# Patient Record
Sex: Female | Born: 1980 | Race: White | Hispanic: No | Marital: Married | Smoking: Never smoker
Health system: Southern US, Community
[De-identification: ages and names within clinical notes are randomized; demographics above are authoritative.]

## PROBLEM LIST (undated history)

## (undated) DIAGNOSIS — F909 Attention-deficit hyperactivity disorder, unspecified type: Secondary | ICD-10-CM

## (undated) DIAGNOSIS — R7303 Prediabetes: Secondary | ICD-10-CM

## (undated) DIAGNOSIS — I10 Essential (primary) hypertension: Secondary | ICD-10-CM

## (undated) DIAGNOSIS — F419 Anxiety disorder, unspecified: Secondary | ICD-10-CM

## (undated) DIAGNOSIS — R519 Headache, unspecified: Secondary | ICD-10-CM

## (undated) DIAGNOSIS — F329 Major depressive disorder, single episode, unspecified: Secondary | ICD-10-CM

## (undated) DIAGNOSIS — F32A Depression, unspecified: Secondary | ICD-10-CM

## (undated) HISTORY — DX: Depression, unspecified: F32.A

## (undated) HISTORY — DX: Major depressive disorder, single episode, unspecified: F32.9

## (undated) HISTORY — PX: FOOT SURGERY: SHX648

## (undated) HISTORY — DX: Anxiety disorder, unspecified: F41.9

---

## 2014-09-17 ENCOUNTER — Ambulatory Visit (INDEPENDENT_AMBULATORY_CARE_PROVIDER_SITE_OTHER): Payer: No Typology Code available for payment source | Admitting: Emergency Medicine

## 2014-09-17 VITALS — BP 118/72 | HR 102 | Temp 98.1°F | Resp 18 | Ht 65.0 in | Wt 151.8 lb

## 2014-09-17 DIAGNOSIS — B882 Other arthropod infestations: Secondary | ICD-10-CM

## 2014-09-17 DIAGNOSIS — B888 Other specified infestations: Secondary | ICD-10-CM | POA: Diagnosis not present

## 2014-09-17 MED ORDER — DOXYCYCLINE HYCLATE 100 MG PO CAPS: 200.0000 mg | ORAL_CAPSULE | Freq: Two times a day (BID) | ORAL | Status: DC

## 2014-09-17 NOTE — Addendum Note (Signed)
Addended by: Roselee Culver on: 09/17/2014 09:44 AM   Modules accepted: Level of Service

## 2014-09-17 NOTE — Patient Instructions (Signed)
Tick Bite Information Ticks are insects that attach themselves to the skin and draw blood for food. There are various types of ticks. Common types include wood ticks and deer ticks. Most ticks live in shrubs and grassy areas. Ticks can climb onto your body when you make contact with leaves or grass where the tick is waiting. The most common places on the body for ticks to attach themselves are the scalp, neck, armpits, waist, and groin. Most tick bites are harmless, but sometimes ticks carry germs that cause diseases. These germs can be spread to a person during the tick's feeding process. The chance of a disease spreading through a tick bite depends on:   The type of tick.  Time of year.   How long the tick is attached.   Geographic location.  HOW CAN YOU PREVENT TICK BITES? Take these steps to help prevent tick bites when you are outdoors:  Wear protective clothing. Long sleeves and long pants are best.   Wear white clothes so you can see ticks more easily.  Tuck your pant legs into your socks.   If walking on a trail, stay in the middle of the trail to avoid brushing against bushes.  Avoid walking through areas with long grass.  Put insect repellent on all exposed skin and along boot tops, pant legs, and sleeve cuffs.   Check clothing, hair, and skin repeatedly and before going inside.   Brush off any ticks that are not attached.  Take a shower or bath as soon as possible after being outdoors.  WHAT IS THE PROPER WAY TO REMOVE A TICK? Ticks should be removed as soon as possible to help prevent diseases caused by tick bites. 1. If latex gloves are available, put them on before trying to remove a tick.  2. Using fine-point tweezers, grasp the tick as close to the skin as possible. You may also use curved forceps or a tick removal tool. Grasp the tick as close to its head as possible. Avoid grasping the tick on its body. 3. Pull gently with steady upward pressure until  the tick lets go. Do not twist the tick or jerk it suddenly. This may break off the tick's head or mouth parts. 4. Do not squeeze or crush the tick's body. This could force disease-carrying fluids from the tick into your body.  5. After the tick is removed, wash the bite area and your hands with soap and water or other disinfectant such as alcohol. 6. Apply a small amount of antiseptic cream or ointment to the bite site.  7. Wash and disinfect any instruments that were used.  Do not try to remove a tick by applying a hot match, petroleum jelly, or fingernail polish to the tick. These methods do not work and may increase the chances of disease being spread from the tick bite.  WHEN SHOULD YOU SEEK MEDICAL CARE? Contact your health care provider if you are unable to remove a tick from your skin or if a part of the tick breaks off and is stuck in the skin.  After a tick bite, you need to be aware of signs and symptoms that could be related to diseases spread by ticks. Contact your health care provider if you develop any of the following in the days or weeks after the tick bite:  Unexplained fever.  Rash. A circular rash that appears days or weeks after the tick bite may indicate the possibility of Lyme disease. The rash may resemble   a target with a bull's-eye and may occur at a different part of your body than the tick bite.  Redness and swelling in the area of the tick bite.   Tender, swollen lymph glands.   Diarrhea.   Weight loss.   Cough.   Fatigue.   Muscle, joint, or bone pain.   Abdominal pain.   Headache.   Lethargy or a change in your level of consciousness.  Difficulty walking or moving your legs.   Numbness in the legs.   Paralysis.  Shortness of breath.   Confusion.   Repeated vomiting.  Document Released: 04/16/2000 Document Revised: 02/07/2013 Document Reviewed: 09/27/2012 ExitCare Patient Information 2015 [employer]. This information is  not intended to replace advice given to you by your health care provider. Make sure you discuss any questions you have with your health care provider.  

## 2014-09-17 NOTE — Progress Notes (Signed)
Subjective:     Patient ID: Laurie Hicks, female   DOB: 07-05-1980, 34 y.o.   MRN: [id number]  HPI   Found an embedded lone star tick this morning. No improvement with over the counter medications or other home remedies.  Denies other complaint or health concern today.   Review of Systems  Constitutional: Negative for fever, chills and appetite change.  HENT: Negative for congestion, ear pain, postnasal drip, sinus pressure and sore throat.   Eyes: Negative for pain and redness.  Respiratory: Negative for cough, shortness of breath and wheezing.   Cardiovascular: Negative for leg swelling.  Gastrointestinal: Negative for nausea, vomiting, abdominal pain, diarrhea, constipation and blood in stool.  Endocrine: Negative for polyuria.  Genitourinary: Negative for dysuria, urgency, frequency and flank pain.  Musculoskeletal: Negative for gait problem.  Skin: Negative for rash.  Neurological: Negative for weakness and headaches.  Psychiatric/Behavioral: Negative for confusion and decreased concentration. The patient is not nervous/anxious.     .    Objective:   Physical Exam  Constitutional: She appears well-developed and well-nourished.  HENT:  Head: Normocephalic and atraumatic.  Eyes: Conjunctivae are normal. Pupils are equal, round, and reactive to light.  Neck: Normal range of motion. Neck supple.  Pulmonary/Chest: Effort normal.  Musculoskeletal: Normal range of motion. She exhibits no edema.  Neurological: She is alert.  Skin: Skin is warm and dry.  Psychiatric: She has a normal mood and affect. Her behavior is normal. Thought content normal.       Assessment:     Tick infestation Doxycycline Appropriate red flags and actions were discussed with the patient who understands.      Plan:     Doxycycline 200 once

## 2016-07-28 DIAGNOSIS — F411 Generalized anxiety disorder: Secondary | ICD-10-CM | POA: Insufficient documentation

## 2018-03-23 ENCOUNTER — Other Ambulatory Visit: Payer: Self-pay | Admitting: Podiatry

## 2018-03-23 ENCOUNTER — Ambulatory Visit: Payer: Managed Care, Other (non HMO) | Admitting: Podiatry

## 2018-03-23 ENCOUNTER — Ambulatory Visit (INDEPENDENT_AMBULATORY_CARE_PROVIDER_SITE_OTHER): Payer: Managed Care, Other (non HMO)

## 2018-03-23 VITALS — BP 135/85 | HR 94

## 2018-03-23 DIAGNOSIS — D3610 Benign neoplasm of peripheral nerves and autonomic nervous system, unspecified: Secondary | ICD-10-CM

## 2018-03-23 DIAGNOSIS — G5792 Unspecified mononeuropathy of left lower limb: Secondary | ICD-10-CM | POA: Diagnosis not present

## 2018-03-23 DIAGNOSIS — M79672 Pain in left foot: Secondary | ICD-10-CM

## 2018-03-23 MED ORDER — TRIAMCINOLONE ACETONIDE 10 MG/ML IJ SUSP
10.0000 mg | Freq: Once | INTRAMUSCULAR | Status: AC
  Administered 2018-03-23: 10 mg

## 2018-03-23 NOTE — Patient Instructions (Signed)
Morton Neuralgia Morton neuralgia is a type of foot pain in the area closest to your toes. This area is sometimes called the ball of your foot. Morton neuralgia occurs when a branch of a nerve in your foot (digital nerve) becomes compressed. When this happens over a long period of time, the nerve can thicken (neuroma) and cause pain. This usually occurs between the third and fourth toe. Morton neuralgia can come and go but may get worse over time. What are the causes? Your digital nerve can become compressed and stretched at a point where it passes under a thick band of tissue that connects your toes (intermetatarsal ligament). Morton neuralgia can be caused by mild repetitive damage in this area. This type of damage can result from:  Activities such as running or jumping.  Wearing shoes that are too tight.  What increases the risk? You may be at risk for Morton neuralgia if you:  Are female.  Wear high heels.  Wear shoes that are narrow or tight.  Participate in activities that stretch your toes. These include: ? Running. ? Ballet. ? Long-distance walking.  What are the signs or symptoms? The first symptom of Morton neuralgia is pain that spreads from the ball of your foot to your toes. It may feel like you are walking on a marble. Pain usually gets worse with walking and goes away at night. Other symptoms may include numbness and cramping of your toes. How is this diagnosed? Your health care provider will do a physical exam. When doing the exam, your health care provider may:  Squeeze your foot just behind your toe.  Ask you to move your toes to check for pain.  You may also have tests on your foot to confirm the diagnosis. These may include:  An X-ray.  An MRI.  How is this treated? Treatment for Morton neuralgia may be as simple as changing the kind of shoes you wear. Other treatments may include:  Wearing a supportive pad (orthosis) under the front of your foot. This  lifts your toe bones and takes pressure off the nerve.  Getting injections of numbing medicine and anti-inflammatory medicine (steroid) in the nerve.  Having surgery to remove part of the thickened nerve.  Follow these instructions at home:  Take medicine only as directed by your health care provider.  Wear soft-soled shoes with a wide toe area.  Stop activities that may be causing pain.  Elevate your foot when resting.  Massage your foot.  Apply ice to the injured area: ? Put ice in a plastic bag. ? Place a towel between your skin and the bag. ? Leave the ice on for 20 minutes, 2-3 times a day.  Keep all follow-up visits as directed by your health care provider. This is important. Contact a health care provider if:  Home care instructions are not helping you get better.  Your symptoms change or get worse. This information is not intended to replace advice given to you by your health care provider. Make sure you discuss any questions you have with your health care provider. Document Released: 07/26/2000 Document Revised: 09/25/2015 Document Reviewed: 06/20/2013 Elsevier Interactive Patient Education  2018 Elsevier Inc.  

## 2018-03-27 NOTE — Progress Notes (Signed)
Subjective:   Patient ID: Laurie Hicks, female   DOB: 37 y.o.   MRN: [id number]   HPI 37 year old female presents the office today for concerns of pain to the left forefoot which gets worse with walking.  She states she gets some burning and aching sensation.  She gets in between the second and third toes more than on the foot she gets some numbness and burning to the second and third toes as well.  She was given a steroid injection which did not help.  She states that it is worse with certain shoes.  She has no other concerns today.   Review of Systems  All other systems reviewed and are negative.  Past Medical History:  Diagnosis Date  . Anxiety   . Depression     Past Surgical History:  Procedure Laterality Date  . FOOT SURGERY       Current Outpatient Medications:  .  Prenat-FeFmCb-DSS-FA-DHA w/o A (CITRANATAL HARMONY) 27-1-260 MG CAPS, Take by mouth., Disp: , Rfl:  .  doxycycline (VIBRAMYCIN) 100 MG capsule, Take 2 capsules (200 mg total) by mouth 2 (two) times daily. (Patient not taking: Reported on 03/23/2018), Disp: 2 capsule, Rfl: 0 .  etonogestrel (NEXPLANON) 74 MG IMPL implant, 1 each by Subdermal route once., Disp: , Rfl:  .  trimethoprim-polymyxin b (POLYTRIM) ophthalmic solution, Place one drop into both eyes 3 (three) times a day., Disp: , Rfl:   Allergies  Allergen Reactions  . Penicillins         Objective:  Physical Exam  General: AAO x3, NAD  Dermatological: Skin is warm, dry and supple bilateral. Nails x 10 are well manicured; remaining integument appears unremarkable at this time. There are no open sores, no preulcerative lesions, no rash or signs of infection present.  Vascular: Dorsalis Pedis artery and Posterior Tibial artery pedal pulses are 2/4 bilateral with immedate capillary fill time. Pedal hair growth present. No varicosities and no lower extremity edema present bilateral. There is no pain with calf compression, swelling, warmth, erythema.    Neruologic: Grossly intact via light touch bilateral. Protective threshold with Semmes Wienstein monofilament intact to all pedal sites bilateral.  Negative Tinel sign.  Musculoskeletal: There is tenderness palpation of the second interspace left foot.  There is a palpable neuroma identified.  There is no area pinpoint tenderness or pain to vibratory sensation.  There is minimal swelling to the second interspace plantarly there is no erythema or warmth.  No swelling dorsally.  Muscular strength 5/5 in all groups tested bilateral.  Gait: Unassisted, Nonantalgic.       Assessment:   Left second interspace pain, likely neuroma     Plan:  -Treatment options discussed including all alternatives, risks, and complications -Etiology of symptoms were discussed -X-rays were obtained and reviewed with the patient.  No definitive evidence of acute fracture or stress fracture identified today. -Direct steroid injection was performed in the left second interspace without complication.  See procedure note below. -Dispensed offloading pads.  We discussed shoe modifications and orthotics.  Procedure: Injection neuroma Discussed alternatives, risks, complications and verbal consent was obtained.  Location: Left second interspace Skin Prep: Alcohol. Injectate: 0.5cc 0.5% marcaine plain, 0.5 cc 2% lidocaine plain and, 1 cc kenalog 10. Disposition: Patient tolerated procedure well. Injection site dressed with a band-aid.  Post-injection care was discussed and return precautions discussed.    Trula Slade DPM

## 2018-03-28 ENCOUNTER — Telehealth: Payer: Self-pay | Admitting: Podiatry

## 2018-03-28 NOTE — Telephone Encounter (Signed)
Pt seen 11/21 and given injection in foot, still experiencing pain. Pt has follow up appt scheduled for 12/16. Should pt try to come in sooner? Please give pt a call.

## 2018-03-28 NOTE — Telephone Encounter (Signed)
Pt states the pain worsened over the weekend and now is back to the way it was prior to the injection. Pt states she is trying to get pregnant and can not take the antiinflammatories. Pt states she is using ice and tylenol. I told pt she could be having a steroid flare after the injections since it began right after the injection. Pt states she looked it up and that's what she thought too. I told pt I could transfer to schedulers for an appt, or she could continue the ice and a stiff bottom shoe until she came in for 04/17/2018, or I could get her an earlier appt. Pt states she will think about it and check her schedule and call again.

## 2018-04-04 ENCOUNTER — Encounter: Payer: Self-pay | Admitting: Podiatry

## 2018-04-04 ENCOUNTER — Ambulatory Visit: Payer: Managed Care, Other (non HMO) | Admitting: Podiatry

## 2018-04-04 DIAGNOSIS — D3610 Benign neoplasm of peripheral nerves and autonomic nervous system, unspecified: Secondary | ICD-10-CM | POA: Diagnosis not present

## 2018-04-04 DIAGNOSIS — M79672 Pain in left foot: Secondary | ICD-10-CM

## 2018-04-04 MED ORDER — MELOXICAM 15 MG PO TABS: 15.0000 mg | ORAL_TABLET | Freq: Every day | ORAL | 0 refills | Status: AC

## 2018-04-05 ENCOUNTER — Telehealth: Payer: Self-pay | Admitting: Podiatry

## 2018-04-05 ENCOUNTER — Telehealth: Payer: Self-pay | Admitting: *Deleted

## 2018-04-05 DIAGNOSIS — M79672 Pain in left foot: Secondary | ICD-10-CM

## 2018-04-05 DIAGNOSIS — D3610 Benign neoplasm of peripheral nerves and autonomic nervous system, unspecified: Secondary | ICD-10-CM

## 2018-04-05 MED ORDER — TRAMADOL HCL 50 MG PO TABS: 50.0000 mg | ORAL_TABLET | Freq: Two times a day (BID) | ORAL | 0 refills | Status: DC | PRN

## 2018-04-05 NOTE — Addendum Note (Signed)
Addended by: Harriett Sine D on: 04/05/2018 05:21 PM   Modules accepted: Orders

## 2018-04-05 NOTE — Telephone Encounter (Signed)
I would recommend to continue with the boot. If she needs we can do tramadol '50mg'$  BID to help with pain as well. Can you also order a ESR, CRP, uric acid, Rheumatoid Factor, ANA, Anti-CCP. Can you please see if they can do the ultrasound ASAP. If they cannot do the ultrasound soon we can see if we can get sports medicine (Dr. Oneida Alar) to see her for an ultrasound.

## 2018-04-05 NOTE — Telephone Encounter (Signed)
Faxed orders to [facility]. 

## 2018-04-05 NOTE — Telephone Encounter (Signed)
I informed pt of Dr. Leigh Aurora orders and informed of the Tramadol rx. Pt asked if she could get information on the Tramadol from the pharmacist and I told her yes. I gave pt the [facility] [phone] scheduling.

## 2018-04-05 NOTE — Telephone Encounter (Signed)
Pt was seen yesterday and was prescribed anti inflammatory medication and was put in a boot until ultrasound is scheduled. Pt is now experiencing severe pain that comes and goes. Pt is fine in the morning and walks with boot for a while until pain starts to come back, when she takes the boot off to ice, her feet/toes hurt/burn.  Please give pt a call to discuss options to help with pain.

## 2018-04-05 NOTE — Telephone Encounter (Signed)
-----   Message from Trula Slade, DPM sent at 04/04/2018  1:18 PM EST ----- Can you please order a diagnostic ultrasound to look for a neuroma

## 2018-04-05 NOTE — Telephone Encounter (Addendum)
Pt states if she does any type of walking it hurts, pt gradually worsens with activity through the day and once removes the boot to ice is unbearable, and has worsened daily. Pt states pain worsens about for about 30 minutes after elevating. I asked pt is she could possibly have the boot on too tight and she states she is not wearing the boot inflated.

## 2018-04-06 ENCOUNTER — Telehealth: Payer: Self-pay | Admitting: *Deleted

## 2018-04-06 NOTE — Telephone Encounter (Signed)
Patient came in this morning and wanted to get the blood work paper work and I stated that the epic system was down and that I would call when the system was back up. Lattie Haw

## 2018-04-06 NOTE — Telephone Encounter (Signed)
Called [facility] and spoke with Selinda Flavin and could he get it  verified that the blood work paper work was there at [facility] and he stated that he tried to call the facility and there was no answer would send an email to the supervisor just to let them know and I also gave the office number [phone]. Laurie Hicks

## 2018-04-06 NOTE — Telephone Encounter (Signed)
Orders for blood work sent to [facility]

## 2018-04-06 NOTE — Addendum Note (Signed)
Addended by: Harriett Sine D on: 04/06/2018 09:19 AM   Modules accepted: Orders

## 2018-04-10 ENCOUNTER — Ambulatory Visit: Payer: Managed Care, Other (non HMO) | Admitting: Podiatry

## 2018-04-10 LAB — C-REACTIVE PROTEIN: CRP: 4.9 mg/L (ref <8.0)

## 2018-04-10 LAB — SEDIMENTATION RATE: Sed Rate: 29 mm/h — ABNORMAL HIGH (ref 0–20)

## 2018-04-10 LAB — CYCLIC CITRUL PEPTIDE ANTIBODY, IGG

## 2018-04-10 LAB — URIC ACID: URIC ACID, SERUM: 3.6 mg/dL (ref 2.5–7.0)

## 2018-04-10 LAB — ANA: ANA: NEGATIVE

## 2018-04-10 LAB — RHEUMATOID FACTOR: Rheumatoid fact SerPl-aCnc: <14 IU/mL (ref <14)

## 2018-04-10 NOTE — Progress Notes (Signed)
Subjective: 37 year old female presents the office today for concerns of worsening pain to the left foot.  She states the day of the injection had some pain but it got somewhat better but then the pain has worsened.  She states that over the last couple days the pain is gradually getting worse and is the worst it has been yet.  The pain is still intermittent.  Describes a burning, sharp pain at the second and third toes. Denies any systemic complaints such as fevers, chills, nausea, vomiting. No acute changes since last appointment, and no other complaints at this time.   Objective: AAO x3, NAD DP/PT pulses palpable bilaterally, CRT less than 3 seconds There is continuation of tenderness on the left foot on second interspace.  There is no specific area pinpoint bony tenderness.  His pain is actually better now than what it was however overall the pain is getting worse. No open lesions or pre-ulcerative lesions.  No pain with calf compression, swelling, warmth, erythema  Assessment: Worsening left foot pain  Plan: -All treatment options discussed with the patient including all alternatives, risks, complications.  -At this point given the pain is worsening we will order ultrasound to evaluate the neuroma or any other issues on the interspace.  I want her to walk a couple areas before she goes that way we can see exactly where the pain is at. -Prescribed mobic. Discussed side effects of the medication and directed to stop if any are to occur and call the office.  -She has a cam boot at home and I discussed that she can wear this as well to help immobilize it.  -Patient encouraged to call the office with any questions, concerns, change in symptoms.   Trula Slade DPM

## 2018-04-11 ENCOUNTER — Ambulatory Visit (HOSPITAL_COMMUNITY)
Admission: RE | Admit: 2018-04-11 | Discharge: 2018-04-11 | Disposition: A | Discharge status: Home / Self Care | Payer: Managed Care, Other (non HMO) | Source: Ambulatory Visit | Attending: Podiatry | Admitting: Podiatry

## 2018-04-11 DIAGNOSIS — G5762 Lesion of plantar nerve, left lower limb: Secondary | ICD-10-CM | POA: Diagnosis not present

## 2018-04-11 DIAGNOSIS — D3610 Benign neoplasm of peripheral nerves and autonomic nervous system, unspecified: Secondary | ICD-10-CM | POA: Insufficient documentation

## 2018-04-11 DIAGNOSIS — M79672 Pain in left foot: Secondary | ICD-10-CM

## 2018-04-12 ENCOUNTER — Telehealth: Payer: Self-pay | Admitting: Podiatry

## 2018-04-12 MED ORDER — DICLOFENAC SODIUM 1 % TD GEL: 2.0000 g | Freq: Four times a day (QID) | TRANSDERMAL | 5 refills | Status: DC

## 2018-04-12 NOTE — Telephone Encounter (Signed)
I informed pt [place] results were available and I had informed DR. Wagoner. I told pt she should stop the Meloxicam if taking with food was not helping. Pt states she has IBS and she didn't know if she was having a flare. I told pt I would ask Dr. Jacqualyn Posey if she could use a topical antiinflammatory. Pt states she and her husband would be trying to get pregnant in the next couple of days and to check if the topical would be okay also.

## 2018-04-12 NOTE — Addendum Note (Signed)
Addended by: Harriett Sine D on: 04/12/2018 04:13 PM   Modules accepted: Orders

## 2018-04-12 NOTE — Telephone Encounter (Signed)
I informed pt of Dr. Leigh Aurora review of results and orders for the topical Voltaren Gel, and the voltaren gel would need insurance approval, but she may decide to self-pay for the gel, and check with the pharmacist if it is okay to take while trying to get pregnant or pregnant. Transferred to scheduler for an appt.

## 2018-04-12 NOTE — Telephone Encounter (Signed)
Please let her know that the ultrasound does shoe a 1.4 cm mass between the metatarsals that favors a mortons neuroma. Can try topical voltaren gel and would hold off on oral medications. Please have her come in to be seen and we can discuss further options.

## 2018-04-12 NOTE — Telephone Encounter (Signed)
Pt had ultrasound yesterday and is following up to see if we have results. Pt also states she is taking an anti inflammatory and is having some stomach trouble, didn't know if the dosage could be decreased. Please give pt a call.

## 2018-04-14 ENCOUNTER — Telehealth: Payer: Self-pay | Admitting: *Deleted

## 2018-04-14 ENCOUNTER — Ambulatory Visit: Payer: Managed Care, Other (non HMO) | Admitting: Podiatry

## 2018-04-14 ENCOUNTER — Telehealth: Payer: Self-pay | Admitting: Podiatry

## 2018-04-14 ENCOUNTER — Encounter: Payer: Self-pay | Admitting: Podiatry

## 2018-04-14 DIAGNOSIS — D3610 Benign neoplasm of peripheral nerves and autonomic nervous system, unspecified: Secondary | ICD-10-CM | POA: Diagnosis not present

## 2018-04-14 NOTE — Telephone Encounter (Signed)
-----   Message from Trula Slade, DPM sent at 04/12/2018  3:27 PM EST ----- Val- please let her know that the blood work was mostly normal. SED rate was slightly increased which just measures inflammation but is nonspecific.

## 2018-04-14 NOTE — Telephone Encounter (Signed)
I called pt and she states she was seen by Dr. Jacqualyn Posey today, and it is a neuroma and she is waiting for a call from the surgical coordinator.

## 2018-04-14 NOTE — Patient Instructions (Signed)

## 2018-04-14 NOTE — Telephone Encounter (Signed)
I called and told Laurie Hicks we would get her rescheduled for this Wednesday, 18 December. I told her someone from the surgical center would call 24-48 hours before to let her know what time to arrive. Pt asked about insurance and if it needed prior authorization. I told her I called and it did not require prior authorization.

## 2018-04-14 NOTE — Telephone Encounter (Signed)
I was just there and had scheduled surgery for Monday, 30 December. Dr. Jacqualyn Posey mentioned he could possibly do this coming Wednesday. Is that still available? It would work better for me. I told the pt I would double check with Dr. Jacqualyn Posey and then I would call her back.

## 2018-04-17 ENCOUNTER — Ambulatory Visit: Payer: Managed Care, Other (non HMO) | Admitting: Podiatry

## 2018-04-17 NOTE — Telephone Encounter (Signed)
Do you want me to call her and tell her anything to do if she plans on still traveling to [place]?

## 2018-04-17 NOTE — Progress Notes (Signed)
Subjective: 37 year old female presents the office today for evaluation of left foot pain.  She states that the pain is been intermittent.  She has pain in the mornings when she first gets up and she has lot of burning to her second and third toes during the day as well.  She has not had to wear the boot intermittently but she did come out of the boot as it was making her foot hurt worse.  The injection did not help.  This point she presents to discuss ultrasound results and further treatment. Denies any systemic complaints such as fevers, chills, nausea, vomiting. No acute changes since last appointment, and no other complaints at this time.   Objective: AAO x3, NAD DP/PT pulses palpable bilaterally, CRT less than 3 seconds There is continuation of tenderness palpation along the second interspace of the left foot.  Palpable neuroma is identified.  Subjectively she is getting sharp pains in the second and third toes as well.  No area pinpoint tenderness or pain vibratory sensation.  Mild swelling along the interspace.  No open lesions or pre-ulcerative lesions.  No pain with calf compression, swelling, warmth, erythema  Assessment: Left foot neuroma  Plan: -All treatment options discussed with the patient including all alternatives, risks, complications.  -Ultrasound results were discussed which did reveal a neuroma. Blood work discussed.  We did discussion regards to conservative as well as surgical treatment options.  After discussion she wants to proceed with surgical intervention.  We discussed with her surgical excision of the neuroma.  Discussed alternatives, risks, complications.  No promises or guarantees were given as yet, the procedure and all questions were answered the best my ability. -The incision placement as well as the postoperative course was discussed with the patient. I discussed risks of the surgery which include, but not limited to, infection, bleeding, pain, swelling, need for  further surgery, delayed or nonhealing, painful or ugly scar, numbness or sensation changes, over/under correction, recurrence, transfer lesions, further deformity, hardware failure, DVT/PE, loss of toe/foot. Patient understands these risks and wishes to proceed with surgery. The surgical consent was reviewed with the patient all 3 pages were signed. No promises or guarantees were given to the outcome of the procedure. All questions were answered to the best of my ability. Before the surgery the patient was encouraged to call the office if there is any further questions. The surgery will be performed at the [facility] on an outpatient basis. -She is trying to get pregnant and discussed if she is pregnant we will have to hold off on surgery.  -Patient encouraged to call the office with any questions, concerns, change in symptoms.   Trula Slade DPM

## 2018-04-17 NOTE — Telephone Encounter (Signed)
We talked about it when she was here. She was going to talk to her husband.

## 2018-04-17 NOTE — Telephone Encounter (Signed)
Whatever works best for her. However, I do not want to do surgery then her drive to [place]

## 2018-04-19 ENCOUNTER — Telehealth: Payer: Self-pay | Admitting: Podiatry

## 2018-04-19 ENCOUNTER — Encounter: Payer: Self-pay | Admitting: Podiatry

## 2018-04-19 ENCOUNTER — Other Ambulatory Visit: Payer: Self-pay | Admitting: Podiatry

## 2018-04-19 DIAGNOSIS — G5762 Lesion of plantar nerve, left lower limb: Secondary | ICD-10-CM

## 2018-04-19 MED ORDER — OXYCODONE-ACETAMINOPHEN 5-325 MG PO TABS: 1.0000 | ORAL_TABLET | ORAL | 0 refills | Status: DC | PRN

## 2018-04-19 MED ORDER — PROMETHAZINE HCL 25 MG PO TABS: 25.0000 mg | ORAL_TABLET | Freq: Three times a day (TID) | ORAL | 0 refills | Status: DC | PRN

## 2018-04-19 MED ORDER — CLINDAMYCIN HCL 150 MG PO CAPS: 150.0000 mg | ORAL_CAPSULE | Freq: Three times a day (TID) | ORAL | 0 refills | Status: DC

## 2018-04-19 NOTE — Telephone Encounter (Signed)
I called pt and asked if she wanted the Ibuprofen 800mg  called to the pharmacy and she stated no, she just wanted to know if Dr. Jacqualyn Posey had called in a prescription.

## 2018-04-19 NOTE — Telephone Encounter (Signed)
Pt had surgery this morning. Was told to take 800mg  of ibuprofen when she got home.  Should pt be taking over the counter or should it have been a prescription? Please give pt a call.

## 2018-04-21 ENCOUNTER — Encounter: Payer: Managed Care, Other (non HMO) | Admitting: Podiatry

## 2018-04-21 ENCOUNTER — Telehealth: Payer: Self-pay | Admitting: *Deleted

## 2018-04-21 NOTE — Telephone Encounter (Signed)
Called and spoke with the patient yesterday and patient stated that she was feeling a lot better and stated that there was no fever or chills and no nausea and I stated to call the office if any concerns or questions. Laurie Hicks

## 2018-04-24 ENCOUNTER — Other Ambulatory Visit: Payer: Managed Care, Other (non HMO)

## 2018-04-24 ENCOUNTER — Ambulatory Visit (INDEPENDENT_AMBULATORY_CARE_PROVIDER_SITE_OTHER): Payer: Managed Care, Other (non HMO) | Admitting: Podiatry

## 2018-04-24 ENCOUNTER — Other Ambulatory Visit: Payer: Self-pay | Admitting: Podiatry

## 2018-04-24 ENCOUNTER — Encounter: Payer: Self-pay | Admitting: Podiatry

## 2018-04-24 VITALS — BP 97/77 | HR 103 | Temp 98.1°F | Resp 18

## 2018-04-24 DIAGNOSIS — D3610 Benign neoplasm of peripheral nerves and autonomic nervous system, unspecified: Secondary | ICD-10-CM

## 2018-04-24 NOTE — Progress Notes (Signed)
Subjective: Laurie Hicks is a 37 y.o. is seen today in office s/p left foot neuroma excision preformed on 04/19/2018. She states that her pain is controlled. She has remained in the CAM boot. Denies any systemic complaints such as fevers, chills, nausea, vomiting. No calf pain, chest pain, shortness of breath.   Objective: General: No acute distress, AAOx3  DP/PT pulses palpable 2/4, CRT < 3 sec to all digits.  Protective sensation intact. Motor function intact.  LEFT foot: Incision is well coapted without any evidence of dehiscence and sutures are intact. There is no surrounding erythema, ascending cellulitis, fluctuance, crepitus, malodor, drainage/purulence. There is trace edema around the surgical site. There is no pain along the surgical site.  Mild ecchymosis present to the toes. No other areas of tenderness to bilateral lower extremities.  No other open lesions or pre-ulcerative lesions.  No pain with calf compression, swelling, warmth, erythema.   Assessment and Plan:  Status post left foot neuroma excision, doing well with no complications   -Treatment options discussed including all alternatives, risks, and complications -Incisions healing well without any dehiscence or signs of infection.  Antibiotic ointment and a bandage was applied. -Ice/elevation -Pain medication as needed-she has not been taking any -Continue cam boot. -She is been traveling to [place] today.  Encouraged elevation and ice.  I want her to get the car and walk every couple hours to help with circulation.  Baby aspirin. -Monitor for any clinical signs or symptoms of infection and DVT/PE and directed to call the office immediately should any occur or go to the ER. -Follow-up in 10 days or sooner if any problems arise. In the meantime, encouraged to call the office with any questions, concerns, change in symptoms.   Celesta Gentile, DPM

## 2018-04-24 NOTE — Telephone Encounter (Signed)
I asked pt if she wanted a refill of the meloxicam and she stated no she was using the ibuprofen.

## 2018-04-28 ENCOUNTER — Encounter: Payer: Self-pay | Admitting: Podiatry

## 2018-05-04 ENCOUNTER — Ambulatory Visit (INDEPENDENT_AMBULATORY_CARE_PROVIDER_SITE_OTHER): Payer: Self-pay | Admitting: Podiatry

## 2018-05-04 DIAGNOSIS — D3610 Benign neoplasm of peripheral nerves and autonomic nervous system, unspecified: Secondary | ICD-10-CM

## 2018-05-09 NOTE — Progress Notes (Signed)
Subjective: Laurie Hicks is a 38 y.o. is seen today in office s/p left foot neuroma excision preformed on 04/19/2018. She states that her pain is controlled and overall she is doing well. She has remained in the CAM boot.  She did develop a fracture of her lower leg and she is not sure if this was coming from the boot bandage and a heat rash but overall this is been getting better as well.  Denies any systemic complaints such as fevers, chills, nausea, vomiting. No calf pain, chest pain, shortness of breath.   Objective: General: No acute distress, AAOx3  DP/PT pulses palpable 2/4, CRT < 3 sec to all digits.  Protective sensation intact. Motor function intact.  LEFT foot: Incision is well coapted without any evidence of dehiscence and sutures are intact. There is no surrounding erythema, ascending cellulitis, fluctuance, crepitus, malodor, drainage/purulence. There is trace edema around the surgical site. There is no pain along the surgical site.  Ecchymosis is resolving.  Incision appears to be healing well with any signs of infection or dehiscence.  The rash the lower leg is almost resolved. No other areas of tenderness to bilateral lower extremities.  No other open lesions or pre-ulcerative lesions.  No pain with calf compression, swelling, warmth, erythema.   Assessment and Plan:  Status post left foot neuroma excision, doing well with no complications   -Treatment options discussed including all alternatives, risks, and complications -Suture ends were cut today.  Antibiotic ointment and a bandage was applied.  She can start to shower tomorrow went over dry thoroughly and apply a similar bandage.  Continue surgical shoe for now.  Continue to ice elevate.  Pain medication as needed but she has not been requiring much. -Monitor for any clinical signs or symptoms of infection and DVT/PE and directed to call the office immediately should any occur or go to the ER. -Follow-up as scheduled or  sooner if any problems arise. In the meantime, encouraged to call the office with any questions, concerns, change in symptoms.   Celesta Gentile, DPM

## 2018-05-16 ENCOUNTER — Ambulatory Visit (INDEPENDENT_AMBULATORY_CARE_PROVIDER_SITE_OTHER): Payer: BLUE CROSS/BLUE SHIELD | Admitting: Podiatry

## 2018-05-16 DIAGNOSIS — D3610 Benign neoplasm of peripheral nerves and autonomic nervous system, unspecified: Secondary | ICD-10-CM

## 2018-05-16 NOTE — Progress Notes (Signed)
Subjective: Laurie Hicks is a 38 y.o. is seen today in office s/p left foot neuroma excision preformed on 04/19/2018.  She says that she is doing better.  Her pain is overall much improved.  She denies any injury or falls and says is doing well.  She was discussed getting back into regular shoes and swelling. She has no other concerns. Denies any systemic complaints such as fevers, chills, nausea, vomiting. No calf pain, chest pain, shortness of breath.   Objective: General: No acute distress, AAOx3  DP/PT pulses palpable 2/4, CRT < 3 sec to all digits.  Protective sensation intact. Motor function intact.  LEFT foot: Incision is well coapted without any evidence of dehiscence and a single sutures intact to the proximal aspect.  Overall the incision is doing well the scar's point.  No surrounding erythema, ascending cellulitis.  There is no fluctuation crepitation any malodor and no evidence of dehiscence.   No other areas of tenderness to bilateral lower extremities.  No other open lesions or pre-ulcerative lesions.  No pain with calf compression, swelling, warmth, erythema.   Assessment and Plan:  Status post left foot neuroma excision, doing well with no complications   -Treatment options discussed including all alternatives, risks, and complications -Overall she is doing much better.  Discussed gradually transition back into regular shoe.  Continue ice and elevate.  Compression anklet.  On a hold off on swimming for now get back into a regular shoe first.  Return in about 3 weeks (around 06/06/2018).  Trula Slade DPM

## 2018-06-01 ENCOUNTER — Ambulatory Visit (INDEPENDENT_AMBULATORY_CARE_PROVIDER_SITE_OTHER): Payer: BLUE CROSS/BLUE SHIELD | Admitting: Podiatry

## 2018-06-01 ENCOUNTER — Encounter: Payer: Self-pay | Admitting: Podiatry

## 2018-06-01 DIAGNOSIS — D3610 Benign neoplasm of peripheral nerves and autonomic nervous system, unspecified: Secondary | ICD-10-CM

## 2018-06-02 NOTE — Progress Notes (Signed)
Subjective: Laurie Hicks is a 38 y.o. is seen today in office s/p left foot neuroma excision preformed on 04/19/2018.  She states that she is doing well and she is back into a regular shoe.  She has some very minimal occasional discomfort but she is back to doing regular activity and wearing regular shoes or even going barefoot but any significant pain she feels much better than she did prior to surgery.  She has no significant swelling.  Scar is well formed.  She denies any fevers, chills, nausea, vomiting.  No calf pain, chest pain, shortness of breath.  No other concerns.   Objective: General: No acute distress, AAOx3  DP/PT pulses palpable 2/4, CRT < 3 sec to all digits.  Protective sensation intact. Motor function intact.  LEFT foot: Incision is well coapted without any evidence of dehiscence a scar is well formed.  There is no surrounding erythema, ascending cellulitis.  No drainage or pus.  Very minimal edema.  There is no tenderness palpation the surgical site has no other areas of tenderness. No other areas of tenderness to bilateral lower extremities.  No other open lesions or pre-ulcerative lesions.  No pain with calf compression, swelling, warmth, erythema.   Assessment and Plan:  Status post left foot neuroma excision, doing well with no complications   -Treatment options discussed including all alternatives, risks, and complications -She is continued to improve and currently with very minimal discomfort subjectively but no pain today.  We discussed wearing supportive shoes and inserts for her shoes.  She is going to look at changing her shoes as well.  She can gradually get back to activity in her normal exercise.  At this point since she is doing well with discharge her from the postoperative care but there is any issues she is to let me know and she agrees with this plan has no further questions.  Trula Slade DPM

## 2018-08-25 LAB — OB RESULTS CONSOLE GC/CHLAMYDIA
Chlamydia: NEGATIVE
Gonorrhea: NEGATIVE

## 2018-08-25 LAB — OB RESULTS CONSOLE RUBELLA ANTIBODY, IGM: Rubella: IMMUNE

## 2018-08-25 LAB — OB RESULTS CONSOLE HEPATITIS B SURFACE ANTIGEN: Hepatitis B Surface Ag: NEGATIVE

## 2018-08-25 LAB — OB RESULTS CONSOLE HIV ANTIBODY (ROUTINE TESTING): HIV: NONREACTIVE

## 2019-02-28 LAB — OB RESULTS CONSOLE GBS: GBS: NEGATIVE

## 2019-03-08 ENCOUNTER — Telehealth (HOSPITAL_COMMUNITY): Payer: Self-pay

## 2019-03-08 ENCOUNTER — Encounter (HOSPITAL_COMMUNITY): Payer: Self-pay

## 2019-03-11 ENCOUNTER — Other Ambulatory Visit (HOSPITAL_COMMUNITY)
Admission: RE | Admit: 2019-03-11 | Discharge: 2019-03-11 | Disposition: A | Discharge status: Home / Self Care | Payer: BC Managed Care – PPO | Source: Ambulatory Visit | Attending: Obstetrics and Gynecology | Admitting: Obstetrics and Gynecology

## 2019-03-11 ENCOUNTER — Other Ambulatory Visit: Payer: Self-pay

## 2019-03-11 DIAGNOSIS — Z20828 Contact with and (suspected) exposure to other viral communicable diseases: Secondary | ICD-10-CM | POA: Insufficient documentation

## 2019-03-11 LAB — SARS CORONAVIRUS 2 (TAT 6-24 HRS): SARS Coronavirus 2: NEGATIVE

## 2019-03-11 NOTE — MAU Note (Signed)
Pt here for PAT covid swab. Denies symptoms. Swab collected.  

## 2019-03-13 ENCOUNTER — Inpatient Hospital Stay (HOSPITAL_COMMUNITY): Payer: BC Managed Care – PPO

## 2019-03-13 ENCOUNTER — Other Ambulatory Visit: Payer: Self-pay

## 2019-03-13 ENCOUNTER — Encounter (HOSPITAL_COMMUNITY)
Admission: AD | Disposition: A | Discharge status: Home / Self Care | Payer: Self-pay | Source: Home / Self Care | Attending: Obstetrics & Gynecology

## 2019-03-13 ENCOUNTER — Encounter (HOSPITAL_COMMUNITY): Payer: Self-pay

## 2019-03-13 ENCOUNTER — Inpatient Hospital Stay (HOSPITAL_COMMUNITY): Payer: BC Managed Care – PPO | Admitting: Anesthesiology

## 2019-03-13 ENCOUNTER — Inpatient Hospital Stay (HOSPITAL_COMMUNITY)
Admission: AD | Admit: 2019-03-13 | Discharge: 2019-03-16 | DRG: 788 | Disposition: A | Discharge status: Home / Self Care | Payer: BC Managed Care – PPO | Attending: Obstetrics & Gynecology | Admitting: Obstetrics & Gynecology

## 2019-03-13 DIAGNOSIS — O139 Gestational [pregnancy-induced] hypertension without significant proteinuria, unspecified trimester: Secondary | ICD-10-CM | POA: Diagnosis present

## 2019-03-13 DIAGNOSIS — Z98891 History of uterine scar from previous surgery: Secondary | ICD-10-CM

## 2019-03-13 DIAGNOSIS — O134 Gestational [pregnancy-induced] hypertension without significant proteinuria, complicating childbirth: Secondary | ICD-10-CM | POA: Diagnosis present

## 2019-03-13 DIAGNOSIS — Z3A37 37 weeks gestation of pregnancy: Secondary | ICD-10-CM | POA: Diagnosis not present

## 2019-03-13 LAB — CBC
HCT: 35.1 % — ABNORMAL LOW (ref 36.0–46.0)
HCT: 35.1 % — ABNORMAL LOW (ref 36.0–46.0)
Hemoglobin: 11.7 g/dL — ABNORMAL LOW (ref 12.0–15.0)
Hemoglobin: 11.7 g/dL — ABNORMAL LOW (ref 12.0–15.0)
MCH: 30.2 pg (ref 26.0–34.0)
MCH: 30.2 pg (ref 26.0–34.0)
MCHC: 33.3 g/dL (ref 30.0–36.0)
MCHC: 33.3 g/dL (ref 30.0–36.0)
MCV: 90.5 fL (ref 80.0–100.0)
MCV: 90.7 fL (ref 80.0–100.0)
Platelets: 295 10*3/uL (ref 150–400)
Platelets: 325 10*3/uL (ref 150–400)
RBC: 3.87 MIL/uL (ref 3.87–5.11)
RBC: 3.88 MIL/uL (ref 3.87–5.11)
RDW: 14.8 % (ref 11.5–15.5)
RDW: 14.8 % (ref 11.5–15.5)
WBC: 14.5 10*3/uL — ABNORMAL HIGH (ref 4.0–10.5)
WBC: 15.5 10*3/uL — ABNORMAL HIGH (ref 4.0–10.5)
nRBC: 0 % (ref 0.0–0.2)
nRBC: 0 % (ref 0.0–0.2)

## 2019-03-13 LAB — COMPREHENSIVE METABOLIC PANEL
ALT: 11 U/L (ref 0–44)
AST: 28 U/L (ref 15–41)
Albumin: 3 g/dL — ABNORMAL LOW (ref 3.5–5.0)
Alkaline Phosphatase: 193 U/L — ABNORMAL HIGH (ref 38–126)
Anion gap: 12 (ref 5–15)
BUN: 6 mg/dL (ref 6–20)
CO2: 19 mmol/L — ABNORMAL LOW (ref 22–32)
Calcium: 9.1 mg/dL (ref 8.9–10.3)
Chloride: 105 mmol/L (ref 98–111)
Creatinine, Ser: 0.65 mg/dL (ref 0.44–1.00)
GFR calc Af Amer: >60 mL/min (ref >60)
GFR calc non Af Amer: >60 mL/min (ref >60)
Glucose, Bld: 93 mg/dL (ref 70–99)
Potassium: 4.2 mmol/L (ref 3.5–5.1)
Sodium: 136 mmol/L (ref 135–145)
Total Bilirubin: 0.4 mg/dL (ref 0.3–1.2)
Total Protein: 6.2 g/dL — ABNORMAL LOW (ref 6.5–8.1)

## 2019-03-13 LAB — TYPE AND SCREEN
ABO/RH(D): O POS
Antibody Screen: NEGATIVE

## 2019-03-13 LAB — RPR: RPR Ser Ql: NONREACTIVE

## 2019-03-13 LAB — ABO/RH: ABO/RH(D): O POS

## 2019-03-13 SURGERY — Surgical Case
Anesthesia: Epidural

## 2019-03-13 MED ORDER — OXYTOCIN BOLUS FROM INFUSION: 500.0000 mL | Freq: Once | INTRAVENOUS | Status: DC

## 2019-03-13 MED ORDER — SOD CITRATE-CITRIC ACID 500-334 MG/5ML PO SOLN
30.0000 mL | ORAL | Status: DC | PRN
  Administered 2019-03-13: 21:00:00 30 mL via ORAL
  Filled 2019-03-13: qty 30

## 2019-03-13 MED ORDER — TERBUTALINE SULFATE 1 MG/ML IJ SOLN: 0.2500 mg | Freq: Once | INTRAMUSCULAR | Status: DC | PRN

## 2019-03-13 MED ORDER — EPHEDRINE 5 MG/ML INJ
10.0000 mg | INTRAVENOUS | Status: DC | PRN
  Filled 2019-03-13 (×2): qty 10

## 2019-03-13 MED ORDER — MORPHINE SULFATE (PF) 0.5 MG/ML IJ SOLN
INTRAMUSCULAR | Status: AC
  Filled 2019-03-13: qty 10

## 2019-03-13 MED ORDER — LACTATED RINGERS IV SOLN
INTRAVENOUS | Status: DC
  Administered 2019-03-13 (×4): via INTRAVENOUS

## 2019-03-13 MED ORDER — DEXAMETHASONE SODIUM PHOSPHATE 4 MG/ML IJ SOLN
INTRAMUSCULAR | Status: AC
  Filled 2019-03-13: qty 1

## 2019-03-13 MED ORDER — GENTAMICIN SULFATE 40 MG/ML IJ SOLN
5.0000 mg/kg | Freq: Once | INTRAVENOUS | Status: AC
  Administered 2019-03-13: 370 mg via INTRAVENOUS
  Filled 2019-03-13: qty 9.25

## 2019-03-13 MED ORDER — SODIUM CHLORIDE (PF) 0.9 % IJ SOLN
INTRAMUSCULAR | Status: DC | PRN
  Administered 2019-03-13: 12 mL/h via EPIDURAL

## 2019-03-13 MED ORDER — FENTANYL CITRATE (PF) 100 MCG/2ML IJ SOLN
INTRAMUSCULAR | Status: DC | PRN
  Administered 2019-03-13: 100 ug via EPIDURAL

## 2019-03-13 MED ORDER — LACTATED RINGERS IV SOLN
INTRAVENOUS | Status: DC | PRN
  Administered 2019-03-13: 23:00:00 via INTRAVENOUS

## 2019-03-13 MED ORDER — SODIUM CHLORIDE 0.9 % IV SOLN
INTRAVENOUS | Status: DC | PRN
  Administered 2019-03-13: via INTRAVENOUS

## 2019-03-13 MED ORDER — LACTATED RINGERS IV SOLN
500.0000 mL | INTRAVENOUS | Status: DC | PRN
  Administered 2019-03-13: 20:00:00 500 mL via INTRAVENOUS

## 2019-03-13 MED ORDER — FENTANYL CITRATE (PF) 100 MCG/2ML IJ SOLN
INTRAMUSCULAR | Status: AC
  Filled 2019-03-13: qty 2

## 2019-03-13 MED ORDER — MISOPROSTOL 25 MCG QUARTER TABLET
25.0000 ug | ORAL_TABLET | ORAL | Status: DC | PRN
  Administered 2019-03-13 (×2): 25 ug via VAGINAL
  Filled 2019-03-13 (×2): qty 1

## 2019-03-13 MED ORDER — ZOLPIDEM TARTRATE 5 MG PO TABS
5.0000 mg | ORAL_TABLET | Freq: Every evening | ORAL | Status: DC | PRN
  Administered 2019-03-13: 01:00:00 5 mg via ORAL
  Filled 2019-03-13: qty 1

## 2019-03-13 MED ORDER — PHENYLEPHRINE HCL (PRESSORS) 10 MG/ML IV SOLN
INTRAVENOUS | Status: DC | PRN
  Administered 2019-03-13: 40 ug via INTRAVENOUS
  Administered 2019-03-13: 80 ug via INTRAVENOUS

## 2019-03-13 MED ORDER — PHENYLEPHRINE HCL-NACL 20-0.9 MG/250ML-% IV SOLN: INTRAVENOUS | Status: DC | PRN

## 2019-03-13 MED ORDER — LACTATED RINGERS IV SOLN
500.0000 mL | Freq: Once | INTRAVENOUS | Status: AC
  Administered 2019-03-13: 500 mL via INTRAVENOUS

## 2019-03-13 MED ORDER — ONDANSETRON HCL 4 MG/2ML IJ SOLN
INTRAMUSCULAR | Status: AC
  Filled 2019-03-13: qty 4

## 2019-03-13 MED ORDER — LIDOCAINE HCL (PF) 1 % IJ SOLN
INTRAMUSCULAR | Status: DC | PRN
  Administered 2019-03-13: 10 mL via EPIDURAL
  Administered 2019-03-13: 2 mL via EPIDURAL

## 2019-03-13 MED ORDER — GENTAMICIN SULFATE 40 MG/ML IJ SOLN: 5.0000 mg/kg | Freq: Once | INTRAVENOUS | Status: DC

## 2019-03-13 MED ORDER — FENTANYL CITRATE (PF) 100 MCG/2ML IJ SOLN: 50.0000 ug | INTRAMUSCULAR | Status: DC | PRN

## 2019-03-13 MED ORDER — CLINDAMYCIN PHOSPHATE 900 MG/50ML IV SOLN
900.0000 mg | Freq: Once | INTRAVENOUS | Status: AC
  Administered 2019-03-13: 21:00:00 900 mg via INTRAVENOUS
  Filled 2019-03-13: qty 50

## 2019-03-13 MED ORDER — OXYTOCIN 40 UNITS IN NORMAL SALINE INFUSION - SIMPLE MED
INTRAVENOUS | Status: DC | PRN
  Administered 2019-03-13: 40 [IU] via INTRAVENOUS

## 2019-03-13 MED ORDER — DIPHENHYDRAMINE HCL 50 MG/ML IJ SOLN: 12.5000 mg | INTRAMUSCULAR | Status: DC | PRN

## 2019-03-13 MED ORDER — LIDOCAINE HCL (PF) 1 % IJ SOLN: 30.0000 mL | INTRAMUSCULAR | Status: DC | PRN

## 2019-03-13 MED ORDER — OXYCODONE-ACETAMINOPHEN 5-325 MG PO TABS: 1.0000 | ORAL_TABLET | ORAL | Status: DC | PRN

## 2019-03-13 MED ORDER — FENTANYL-BUPIVACAINE-NACL 0.5-0.125-0.9 MG/250ML-% EP SOLN
12.0000 mL/h | EPIDURAL | Status: DC | PRN
  Filled 2019-03-13: qty 250

## 2019-03-13 MED ORDER — OXYCODONE-ACETAMINOPHEN 5-325 MG PO TABS: 2.0000 | ORAL_TABLET | ORAL | Status: DC | PRN

## 2019-03-13 MED ORDER — OXYTOCIN 40 UNITS IN NORMAL SALINE INFUSION - SIMPLE MED
2.5000 [IU]/h | INTRAVENOUS | Status: DC
  Filled 2019-03-13: qty 1000

## 2019-03-13 MED ORDER — OXYTOCIN 40 UNITS IN NORMAL SALINE INFUSION - SIMPLE MED
INTRAVENOUS | Status: AC
  Filled 2019-03-13: qty 1000

## 2019-03-13 MED ORDER — PHENYLEPHRINE 40 MCG/ML (10ML) SYRINGE FOR IV PUSH (FOR BLOOD PRESSURE SUPPORT)
80.0000 ug | PREFILLED_SYRINGE | INTRAVENOUS | Status: DC | PRN
  Filled 2019-03-13: qty 10

## 2019-03-13 MED ORDER — ACETAMINOPHEN 325 MG PO TABS: 650.0000 mg | ORAL_TABLET | ORAL | Status: DC | PRN

## 2019-03-13 MED ORDER — DEXAMETHASONE SODIUM PHOSPHATE 4 MG/ML IJ SOLN
INTRAMUSCULAR | Status: DC | PRN
  Administered 2019-03-13: 4 mg via INTRAVENOUS

## 2019-03-13 MED ORDER — ONDANSETRON HCL 4 MG/2ML IJ SOLN
4.0000 mg | Freq: Four times a day (QID) | INTRAMUSCULAR | Status: DC | PRN
  Administered 2019-03-13: 4 mg via INTRAVENOUS
  Filled 2019-03-13: qty 2

## 2019-03-13 MED ORDER — LIDOCAINE-EPINEPHRINE (PF) 2 %-1:200000 IJ SOLN
INTRAMUSCULAR | Status: DC | PRN
  Administered 2019-03-13 (×2): 5 mL via EPIDURAL

## 2019-03-13 MED ORDER — OXYTOCIN 40 UNITS IN NORMAL SALINE INFUSION - SIMPLE MED
1.0000 m[IU]/min | INTRAVENOUS | Status: DC
  Administered 2019-03-13: 13:00:00 2 m[IU]/min via INTRAVENOUS

## 2019-03-13 MED ORDER — PHENYLEPHRINE 40 MCG/ML (10ML) SYRINGE FOR IV PUSH (FOR BLOOD PRESSURE SUPPORT): 80.0000 ug | PREFILLED_SYRINGE | INTRAVENOUS | Status: DC | PRN

## 2019-03-13 MED ORDER — ONDANSETRON HCL 4 MG/2ML IJ SOLN
INTRAMUSCULAR | Status: DC | PRN
  Administered 2019-03-13: 4 mg via INTRAVENOUS

## 2019-03-13 MED ORDER — SODIUM CHLORIDE 0.9 % IR SOLN
Status: DC | PRN
  Administered 2019-03-13: 1000 mL

## 2019-03-13 MED ORDER — MORPHINE SULFATE (PF) 0.5 MG/ML IJ SOLN
INTRAMUSCULAR | Status: DC | PRN
  Administered 2019-03-13: 3 mg via EPIDURAL

## 2019-03-13 MED ORDER — EPHEDRINE 5 MG/ML INJ
10.0000 mg | INTRAVENOUS | Status: DC | PRN
  Administered 2019-03-13: 21:00:00 10 mg via INTRAVENOUS

## 2019-03-13 MED ORDER — LACTATED RINGERS AMNIOINFUSION
INTRAVENOUS | Status: DC
  Administered 2019-03-13: 21:00:00 via INTRAUTERINE

## 2019-03-13 SURGICAL SUPPLY — 33 items
BENZOIN TINCTURE PRP APPL 2/3 (GAUZE/BANDAGES/DRESSINGS) ×2 IMPLANT
CHLORAPREP W/TINT 26ML (MISCELLANEOUS) IMPLANT
CLAMP CORD UMBIL (MISCELLANEOUS) ×2 IMPLANT
CLOTH BEACON ORANGE TIMEOUT ST (SAFETY) ×2 IMPLANT
DERMABOND ADVANCED (GAUZE/BANDAGES/DRESSINGS)
DERMABOND ADVANCED .7 DNX12 (GAUZE/BANDAGES/DRESSINGS) IMPLANT
DRSG OPSITE POSTOP 4X10 (GAUZE/BANDAGES/DRESSINGS) ×2 IMPLANT
ELECT REM PT RETURN 9FT ADLT (ELECTROSURGICAL) ×2
ELECTRODE REM PT RTRN 9FT ADLT (ELECTROSURGICAL) ×1 IMPLANT
EXTRACTOR VACUUM KIWI (MISCELLANEOUS) ×2 IMPLANT
GLOVE BIO SURGEON STRL SZ 6 (GLOVE) ×2 IMPLANT
GLOVE BIOGEL PI IND STRL 6 (GLOVE) ×2 IMPLANT
GLOVE BIOGEL PI IND STRL 7.0 (GLOVE) ×5 IMPLANT
GLOVE BIOGEL PI INDICATOR 6 (GLOVE) ×2
GLOVE BIOGEL PI INDICATOR 7.0 (GLOVE) ×5
GOWN STRL REUS W/TWL LRG LVL3 (GOWN DISPOSABLE) ×4 IMPLANT
KIT ABG SYR 3ML LUER SLIP (SYRINGE) ×2 IMPLANT
NEEDLE HYPO 25X5/8 SAFETYGLIDE (NEEDLE) ×2 IMPLANT
NS IRRIG 1000ML POUR BTL (IV SOLUTION) ×2 IMPLANT
PACK C SECTION WH (CUSTOM PROCEDURE TRAY) ×2 IMPLANT
PAD OB MATERNITY 4.3X12.25 (PERSONAL CARE ITEMS) ×2 IMPLANT
PENCIL SMOKE EVAC W/HOLSTER (ELECTROSURGICAL) ×2 IMPLANT
SPONGE STICK PVP PAINT (SPONGE) ×4 IMPLANT
STRIP CLOSURE SKIN 1/2X4 (GAUZE/BANDAGES/DRESSINGS) ×2 IMPLANT
SUT CHROMIC 0 CTX 36 (SUTURE) ×6 IMPLANT
SUT MON AB 2-0 CT1 27 (SUTURE) ×2 IMPLANT
SUT PDS AB 0 CT1 27 (SUTURE) IMPLANT
SUT PLAIN 0 NONE (SUTURE) IMPLANT
SUT VIC AB 0 CT1 36 (SUTURE) IMPLANT
SUT VIC AB 4-0 KS 27 (SUTURE) ×2 IMPLANT
TOWEL OR 17X24 6PK STRL BLUE (TOWEL DISPOSABLE) ×2 IMPLANT
TRAY FOLEY W/BAG SLVR 14FR LF (SET/KITS/TRAYS/PACK) IMPLANT
WATER STERILE IRR 1000ML POUR (IV SOLUTION) ×2 IMPLANT

## 2019-03-13 NOTE — Consult Note (Signed)
Delivery Note:  C-section       03/13/2019  11:56 PM  I was called to the operating room at the request of the patient's obstetrician (Dr. Lynnette Caffey) for a primary c-section.  PRENATAL HX:  This is a 38 y/o G2P0010 at 68 and 2/[redacted] weeks gestation who was admitted for IOL due to gestational hypertension.  Her pregnancy has been complicated by anxiety reaction, no medication, and AMA.  She is GBS negative.  AROM x7 hours.  C-section for fetal intolerance to labor with repetitive late decelerations.    DELIVERY:  Cord clamping delayed 1 minute. Infant was vigorous at delivery, requiring no resuscitation other than standard warming, drying and stimulation.  APGARs 8 and 9.  Exam within normal limits.  After 5 minutes, baby left with nurse to assist parents with skin-to-skin care.   _____________________ Electronically Signed By: Clinton Gallant, MD Neonatologist

## 2019-03-13 NOTE — H&P (Signed)
Laurie Hicks is a 38 y.o. female G2P0010 at [redacted]w[redacted]d presenting for IOL for GHTN.  Patient denies HA, vision change, RUQ pain, CP/SOB.  Pre-eclampsia labs wnl.  BP wnl.  She received her second dose of VMP at 0500 and is feeling mild cramps.  Patient has h/o anxiety and has been well controlled this pregnancy without medication.  She is AMA and had low risk NIPS.  GBS negative.  OB History    Gravida  2   Para      Term      Preterm      AB  1   Living        SAB  1   TAB      Ectopic      Multiple      Live Births             Past Medical History:  Diagnosis Date  . Anxiety   . Depression    Past Surgical History:  Procedure Laterality Date  . FOOT SURGERY     Family History: family history includes Heart disease in her maternal grandfather and paternal grandfather; Hypertension in her mother. Social History:  reports that she has never smoked. She has never used smokeless tobacco. She reports previous alcohol use. She reports that she does not use drugs.     Maternal Diabetes: No Genetic Screening: Normal Maternal Ultrasounds/Referrals: Normal Fetal Ultrasounds or other Referrals:  None Maternal Substance Abuse:  No Significant Maternal Medications:  None Significant Maternal Lab Results:  Group B Strep negative Other Comments:  None  ROS Maternal Medical History:  Fetal activity: Perceived fetal activity is normal.   Last perceived fetal movement was within the past hour.    Prenatal complications: PIH.   Prenatal Complications - Diabetes: none.    Dilation: 1 Effacement (%): Thick Station: -3 Exam by:: B McClam, RN  Blood pressure 128/80, pulse (!) 104, temperature 98.1 F (36.7 C), temperature source Oral, resp. rate 18, height 5\' 5"  (1.651 m), weight 98.4 kg. Maternal Exam:  Uterine Assessment: Contraction strength is mild.  Contraction frequency is irregular.   Abdomen: Patient reports no abdominal tenderness. Fundal height is c/w dates.    Estimated fetal weight is 7#.       Fetal Exam Fetal Monitor Review: Baseline rate: 145.  Variability: moderate (6-25 bpm).   Pattern: accelerations present and no decelerations.    Fetal State Assessment: Category I - tracings are normal.     Physical Exam  Constitutional: She is oriented to person, place, and time. She appears well-developed.  HENT:  Head: Normocephalic and atraumatic.  Neck: Normal range of motion.  Respiratory: Effort normal.  GI: Soft. There is no rebound and no guarding.  Musculoskeletal: Normal range of motion.  Neurological: She is alert and oriented to person, place, and time.  Skin: Skin is warm and dry.  Psychiatric: She has a normal mood and affect. Her behavior is normal.    Prenatal labs: ABO, Rh: --/--/O POS, O POS Performed at [facility], [place] [address] [place], [place] [address]  [phone]) Antibody: NEG (11/10 0015) Rubella: Immune (04/24 0000) RPR:   NR HBsAg: Negative (04/24 0000)  HIV: Non-reactive (04/24 0000)  GBS: Negative/-- (10/28 0000)   Assessment/Plan: 38yo G2P0 at [redacted]w[redacted]d for IOL for GHTN -Recheck at 9 am; AROM vs pitocin -CLEA if desired -GHTN-stable without medication.  IV anti-hypertensive and magnesium prn severe range BPs -Anticipate NSVD  Linda Hedges 03/13/2019, 8:18  AM    

## 2019-03-13 NOTE — Progress Notes (Signed)
Alto Mongold is a 38 y.o. G2P0010 at [redacted]w[redacted]d by ultrasound admitted for induction of labor due to [facility].  Subjective: Feeling mild CTX  Objective: BP 117/79   Pulse 98   Temp 98.1 F (36.7 C) (Oral)   Resp 18   Ht 5\' 5"  (1.651 m)   Wt 98.4 kg   BMI 36.09 kg/m  No intake/output data recorded. No intake/output data recorded.  FHT:  FHR: 150 bpm, variability: moderate,  accelerations:  Present,  decelerations:  Absent UC:   regular, every 2 minutes SVE:   Dilation: 4 Effacement (%): 50 Station: -2 Exam by:: Wess Botts RNC  AROM, clear  Labs: Lab Results  Component Value Date   WBC 15.5 (H) 03/13/2019   HGB 11.7 (L) 03/13/2019   HCT 35.1 (L) 03/13/2019   MCV 90.5 03/13/2019   PLT 295 03/13/2019    Assessment / Plan: Induction of labor due to GHTN,  progressing well on pitocin  Labor: Progressing normally Preeclampsia:  n/a Fetal Wellbeing:  Category I Pain Control:  Labor support without medications I/D:  n/a Anticipated MOD:  NSVD  Linda Hedges 03/13/2019, 4:59 PM

## 2019-03-13 NOTE — Progress Notes (Signed)
SVE 1-2/50/-3, Foley bulb placed without difficulty. Pitocin 2/2 started.  Linda Hedges, DO

## 2019-03-13 NOTE — Anesthesia Procedure Notes (Signed)
Epidural Patient location during procedure: OB Start time: 03/13/2019 6:06 PM End time: 03/13/2019 6:16 PM  Staffing Anesthesiologist: Pervis Hocking, DO Performed: anesthesiologist   Preanesthetic Checklist Completed: patient identified, pre-op evaluation, timeout performed, IV checked, risks and benefits discussed and monitors and equipment checked  Epidural Patient position: sitting Prep: site prepped and draped and DuraPrep Patient monitoring: continuous pulse ox, blood pressure, heart rate and cardiac monitor Approach: midline Location: L3-L4 Injection technique: LOR air  Needle:  Needle type: Tuohy  Needle gauge: 17 G Needle length: 9 cm Needle insertion depth: 5 cm Catheter type: closed end flexible Catheter size: 19 Gauge Catheter at skin depth: 10 cm Test dose: negative  Assessment Sensory level: T8 Events: blood not aspirated, injection not painful, no injection resistance, negative IV test and no paresthesia  Additional Notes Patient identified. Risks/Benefits/Options discussed with patient including but not limited to bleeding, infection, nerve damage, paralysis, failed block, incomplete pain control, headache, blood pressure changes, nausea, vomiting, reactions to medication both or allergic, itching and postpartum back pain. Confirmed with bedside nurse the patient's most recent platelet count. Confirmed with patient that they are not currently taking any anticoagulation, have any bleeding history or any family history of bleeding disorders. Patient expressed understanding and wished to proceed. All questions were answered. Sterile technique was used throughout the entire procedure. Please see nursing notes for vital signs. Test dose was given through epidural catheter and negative prior to continuing to dose epidural or start infusion. Warning signs of high block given to the patient including shortness of breath, tingling/numbness in hands, complete motor  block, or any concerning symptoms with instructions to call for help. Patient was given instructions on fall risk and not to get out of bed. All questions and concerns addressed with instructions to call with any issues or inadequate analgesia.  Reason for block:procedure for pain

## 2019-03-13 NOTE — Progress Notes (Signed)
Foley bulb out. To start pitocin 2/2.  Linda Hedges, DO

## 2019-03-13 NOTE — Progress Notes (Signed)
Patient is having repetitive late decelerations despite pitocin rest, position change, oxygen application.  The patient is counseled re: my recommendation for C/S for fetal intolerance of labor.  The patient is counseled re: risk of bleeding, infection, scarring, and damage to surrounding structures.  She understands implications in future pregnancies including uterine rupture and abnormal placentation.  All questions were answered and we will proceed.  Linda Hedges, DO

## 2019-03-13 NOTE — Anesthesia Preprocedure Evaluation (Signed)
Anesthesia Evaluation  Patient identified by MRN, date of birth, ID band Patient awake    Reviewed: Allergy & Precautions, NPO status , Patient's Chart, lab work & pertinent test results  Airway Mallampati: II  TM Distance: >3 FB Neck ROM: Full    Dental no notable dental hx.    Pulmonary neg pulmonary ROS,    Pulmonary exam normal breath sounds clear to auscultation       Cardiovascular negative cardio ROS Normal cardiovascular exam Rhythm:Regular Rate:Normal     Neuro/Psych PSYCHIATRIC DISORDERS Anxiety Depression negative neurological ROS     GI/Hepatic negative GI ROS, Neg liver ROS,   Endo/Other  negative endocrine ROS  Renal/GU negative Renal ROS  negative genitourinary   Musculoskeletal Neuroma- takes percocet, meloxicam, diclofenac, tramadol for pain   Abdominal   Peds negative pediatric ROS (+)  Hematology negative hematology ROS (+)   Anesthesia Other Findings Allergy to chloraprep   Reproductive/Obstetrics (+) Pregnancy                             Anesthesia Physical Anesthesia Plan  ASA: II and emergent  Anesthesia Plan: Epidural   Post-op Pain Management:    Induction:   PONV Risk Score and Plan: 2  Airway Management Planned: Natural Airway  Additional Equipment: None  Intra-op Plan:   Post-operative Plan:   Informed Consent: I have reviewed the patients History and Physical, chart, labs and discussed the procedure including the risks, benefits and alternatives for the proposed anesthesia with the patient or authorized representative who has indicated his/her understanding and acceptance.       Plan Discussed with:   Anesthesia Plan Comments:         Anesthesia Quick Evaluation

## 2019-03-14 ENCOUNTER — Encounter (HOSPITAL_COMMUNITY): Payer: Self-pay

## 2019-03-14 DIAGNOSIS — Z98891 History of uterine scar from previous surgery: Secondary | ICD-10-CM

## 2019-03-14 LAB — CBC
HCT: 32.4 % — ABNORMAL LOW (ref 36.0–46.0)
Hemoglobin: 10.6 g/dL — ABNORMAL LOW (ref 12.0–15.0)
MCH: 29.9 pg (ref 26.0–34.0)
MCHC: 32.7 g/dL (ref 30.0–36.0)
MCV: 91.3 fL (ref 80.0–100.0)
Platelets: 274 10*3/uL (ref 150–400)
RBC: 3.55 MIL/uL — ABNORMAL LOW (ref 3.87–5.11)
RDW: 14.9 % (ref 11.5–15.5)
WBC: 19.5 10*3/uL — ABNORMAL HIGH (ref 4.0–10.5)
nRBC: 0 % (ref 0.0–0.2)

## 2019-03-14 MED ORDER — OXYCODONE HCL 5 MG PO TABS
5.0000 mg | ORAL_TABLET | Freq: Four times a day (QID) | ORAL | Status: DC | PRN
  Administered 2019-03-14: 5 mg via ORAL
  Filled 2019-03-14: qty 1

## 2019-03-14 MED ORDER — ACETAMINOPHEN 325 MG PO TABS
650.0000 mg | ORAL_TABLET | ORAL | Status: DC | PRN
  Administered 2019-03-15 – 2019-03-16 (×3): 650 mg via ORAL
  Filled 2019-03-14 (×3): qty 2

## 2019-03-14 MED ORDER — TETANUS-DIPHTH-ACELL PERTUSSIS 5-2.5-18.5 LF-MCG/0.5 IM SUSP: 0.5000 mL | Freq: Once | INTRAMUSCULAR | Status: DC

## 2019-03-14 MED ORDER — PROMETHAZINE HCL 25 MG/ML IJ SOLN: 6.2500 mg | INTRAMUSCULAR | Status: DC | PRN

## 2019-03-14 MED ORDER — ZOLPIDEM TARTRATE 5 MG PO TABS: 5.0000 mg | ORAL_TABLET | Freq: Every evening | ORAL | Status: DC | PRN

## 2019-03-14 MED ORDER — SIMETHICONE 80 MG PO CHEW
80.0000 mg | CHEWABLE_TABLET | ORAL | Status: DC
  Administered 2019-03-14 – 2019-03-15 (×2): 80 mg via ORAL
  Filled 2019-03-14 (×2): qty 1

## 2019-03-14 MED ORDER — LACTATED RINGERS IV SOLN
INTRAVENOUS | Status: DC
  Administered 2019-03-14: 13:00:00 via INTRAVENOUS

## 2019-03-14 MED ORDER — PRENATAL MULTIVITAMIN CH
1.0000 | ORAL_TABLET | Freq: Every day | ORAL | Status: DC
  Administered 2019-03-14 – 2019-03-16 (×3): 1 via ORAL
  Filled 2019-03-14 (×3): qty 1

## 2019-03-14 MED ORDER — KETOROLAC TROMETHAMINE 30 MG/ML IJ SOLN
INTRAMUSCULAR | Status: AC
  Filled 2019-03-14: qty 1

## 2019-03-14 MED ORDER — SODIUM CHLORIDE 0.9% FLUSH: 3.0000 mL | INTRAVENOUS | Status: DC | PRN

## 2019-03-14 MED ORDER — HYDROMORPHONE HCL 1 MG/ML IJ SOLN: 0.2500 mg | INTRAMUSCULAR | Status: DC | PRN

## 2019-03-14 MED ORDER — DIPHENHYDRAMINE HCL 25 MG PO CAPS: 25.0000 mg | ORAL_CAPSULE | ORAL | Status: DC | PRN

## 2019-03-14 MED ORDER — DIBUCAINE (PERIANAL) 1 % EX OINT: 1.0000 "application " | TOPICAL_OINTMENT | CUTANEOUS | Status: DC | PRN

## 2019-03-14 MED ORDER — OXYCODONE-ACETAMINOPHEN 5-325 MG PO TABS: 1.0000 | ORAL_TABLET | ORAL | Status: DC | PRN

## 2019-03-14 MED ORDER — OXYCODONE HCL 5 MG/5ML PO SOLN: 5.0000 mg | Freq: Once | ORAL | Status: DC | PRN

## 2019-03-14 MED ORDER — FENTANYL CITRATE (PF) 100 MCG/2ML IJ SOLN
INTRAMUSCULAR | Status: AC
  Filled 2019-03-14: qty 2

## 2019-03-14 MED ORDER — KETOROLAC TROMETHAMINE 30 MG/ML IJ SOLN
30.0000 mg | Freq: Once | INTRAMUSCULAR | Status: AC | PRN
  Administered 2019-03-14: 01:00:00 30 mg via INTRAVENOUS

## 2019-03-14 MED ORDER — KETOROLAC TROMETHAMINE 30 MG/ML IJ SOLN: 30.0000 mg | Freq: Four times a day (QID) | INTRAMUSCULAR | Status: AC | PRN

## 2019-03-14 MED ORDER — NALOXONE HCL 4 MG/10ML IJ SOLN
1.0000 ug/kg/h | INTRAVENOUS | Status: DC | PRN
  Filled 2019-03-14: qty 5

## 2019-03-14 MED ORDER — SENNOSIDES-DOCUSATE SODIUM 8.6-50 MG PO TABS
2.0000 | ORAL_TABLET | ORAL | Status: DC
  Administered 2019-03-14 – 2019-03-15 (×2): 2 via ORAL
  Filled 2019-03-14 (×2): qty 2

## 2019-03-14 MED ORDER — MENTHOL 3 MG MT LOZG: 1.0000 | LOZENGE | OROMUCOSAL | Status: DC | PRN

## 2019-03-14 MED ORDER — NALOXONE HCL 0.4 MG/ML IJ SOLN: 0.4000 mg | INTRAMUSCULAR | Status: DC | PRN

## 2019-03-14 MED ORDER — SIMETHICONE 80 MG PO CHEW
80.0000 mg | CHEWABLE_TABLET | Freq: Three times a day (TID) | ORAL | Status: DC
  Administered 2019-03-14 – 2019-03-16 (×7): 80 mg via ORAL
  Filled 2019-03-14 (×7): qty 1

## 2019-03-14 MED ORDER — WITCH HAZEL-GLYCERIN EX PADS: 1.0000 "application " | MEDICATED_PAD | CUTANEOUS | Status: DC | PRN

## 2019-03-14 MED ORDER — ACETAMINOPHEN 500 MG PO TABS
1000.0000 mg | ORAL_TABLET | Freq: Four times a day (QID) | ORAL | Status: AC
  Administered 2019-03-14 (×3): 1000 mg via ORAL
  Filled 2019-03-14 (×3): qty 2

## 2019-03-14 MED ORDER — NALBUPHINE HCL 10 MG/ML IJ SOLN
5.0000 mg | Freq: Once | INTRAMUSCULAR | Status: DC | PRN
  Filled 2019-03-14: qty 0.5

## 2019-03-14 MED ORDER — DIPHENHYDRAMINE HCL 50 MG/ML IJ SOLN: 12.5000 mg | INTRAMUSCULAR | Status: DC | PRN

## 2019-03-14 MED ORDER — SIMETHICONE 80 MG PO CHEW: 80.0000 mg | CHEWABLE_TABLET | ORAL | Status: DC | PRN

## 2019-03-14 MED ORDER — MEPERIDINE HCL 25 MG/ML IJ SOLN: 6.2500 mg | INTRAMUSCULAR | Status: DC | PRN

## 2019-03-14 MED ORDER — COCONUT OIL OIL
1.0000 "application " | TOPICAL_OIL | Status: DC | PRN
  Administered 2019-03-14: 1 via TOPICAL

## 2019-03-14 MED ORDER — OXYTOCIN 40 UNITS IN NORMAL SALINE INFUSION - SIMPLE MED: 2.5000 [IU]/h | INTRAVENOUS | Status: AC

## 2019-03-14 MED ORDER — NALBUPHINE HCL 10 MG/ML IJ SOLN
5.0000 mg | INTRAMUSCULAR | Status: DC | PRN
  Filled 2019-03-14: qty 0.5

## 2019-03-14 MED ORDER — SCOPOLAMINE 1 MG/3DAYS TD PT72: 1.0000 | MEDICATED_PATCH | Freq: Once | TRANSDERMAL | Status: DC

## 2019-03-14 MED ORDER — ONDANSETRON HCL 4 MG/2ML IJ SOLN: 4.0000 mg | Freq: Three times a day (TID) | INTRAMUSCULAR | Status: DC | PRN

## 2019-03-14 MED ORDER — DIPHENHYDRAMINE HCL 25 MG PO CAPS: 25.0000 mg | ORAL_CAPSULE | Freq: Four times a day (QID) | ORAL | Status: DC | PRN

## 2019-03-14 MED ORDER — IBUPROFEN 800 MG PO TABS
800.0000 mg | ORAL_TABLET | Freq: Four times a day (QID) | ORAL | Status: DC
  Administered 2019-03-14 – 2019-03-16 (×8): 800 mg via ORAL
  Filled 2019-03-14 (×8): qty 1

## 2019-03-14 MED ORDER — OXYCODONE HCL 5 MG PO TABS: 5.0000 mg | ORAL_TABLET | Freq: Once | ORAL | Status: DC | PRN

## 2019-03-14 NOTE — Op Note (Signed)
Laurie Hicks PROCEDURE DATE: 03/13/2019 - 03/14/2019  PREOPERATIVE DIAGNOSIS: Intrauterine pregnancy at  [redacted]w[redacted]d weeks gestation, GHTN, fetal intolerance of labor  POSTOPERATIVE DIAGNOSIS: The same  PROCEDURE:   Primary Low Transverse Cesarean Section  SURGEON:  Dr. Linda Hedges  INDICATIONS: Laurie Hicks Patient is a 38 y.o. G2P0010 at [redacted]w[redacted]d scheduled for cesarean section secondary to fetal intolerance of labor.  The risks of cesarean section discussed with the patient included but were not limited to: bleeding which may require transfusion or reoperation; infection which may require antibiotics; injury to bowel, bladder, ureters or other surrounding organs; injury to the fetus; need for additional procedures including hysterectomy in the event of a life-threatening hemorrhage; placental abnormalities wth subsequent pregnancies, incisional problems, thromboembolic phenomenon and other postoperative/anesthesia complications. The patient concurred with the proposed plan, giving informed written consent for the procedure.    FINDINGS:  Viable female infant in cephalic presentation, APGARs 8,9: weight pending  Clear amniotic fluid.  Intact placenta, three vessel cord.  Grossly normal uterus, ovaries and fallopian tubes. .   ANESTHESIA:    Epidural ESTIMATED BLOOD LOSS: 600 ml SPECIMENS: Placenta sent to L&D COMPLICATIONS: None immediate  PROCEDURE IN DETAIL:  The patient received intravenous antibiotics and had sequential compression devices applied to her lower extremities while in the preoperative area.  She was then taken to the operating room where epidural anesthesia was dosed up to surgical level and was found to be adequate. She was then placed in a dorsal supine position with a leftward tilt, and prepped and draped in a sterile manner.  A foley catheter was placed into her bladder and attached to constant gravity.  After an adequate timeout was performed, a Pfannenstiel skin incision was made  with scalpel and carried through to the underlying layer of fascia. The fascia was incised in the midline and this incision was extended bilaterally using the Mayo scissors. Kocher clamps were applied to the superior aspect of the fascial incision and the underlying rectus muscles were dissected off bluntly. A similar process was carried out on the inferior aspect of the facial incision. The rectus muscles were separated in the midline bluntly and the peritoneum was entered bluntly.  Bladder flap was created sharply and developed bluntly.  Bladder blade was placed.  A transverse hysterotomy was made with a scalpel and extended bilaterally bluntly. The bladder blade was then removed. The infant was successfully delivered using a single Kiwi vacuum pull, and cord was clamped and cut and infant was handed over to awaiting neonatology team. Uterine massage was then administered and the placenta delivered intact with three-vessel cord. The uterus was cleared of clot and debris.  The hysterotomy was closed with 0 chromic.  A second imbricating suture of 0-chromic was used to reinforce the incision and aid in hemostasis.  The peritoneum and rectus muscles were noted to be hemostatic and were reapproximated using 3-0 monocryl in a running fashion.  The fascia was closed with 0-Vicryl in a running fashion with good restoration of anatomy.  The subcutaneus tissue was copiously irrigated.  The skin was closed with 3-0 vicryl in a subcuticular fashion.  Pt tolerated the procedure will.  All counts were correct x2.  Pt went to the recovery room in stable condition.

## 2019-03-14 NOTE — Transfer of Care (Signed)
Immediate Anesthesia Transfer of Care Note  Patient: Laurie Hicks  Procedure(s) Performed: CESAREAN SECTION (N/A )  Patient Location: PACU  Anesthesia Type:Epidural  Level of Consciousness: awake, alert  and oriented  Airway & Oxygen Therapy: Patient Spontanous Breathing  Post-op Assessment: Report given to RN and Post -op Vital signs reviewed and stable  Post vital signs: Reviewed and stable  Last Vitals:  Vitals Value Taken Time  BP 114/68 03/14/19 0015  Temp    Pulse 113 03/14/19 0017  Resp 42 03/14/19 0017  SpO2 98 % 03/14/19 0017  Vitals shown include unvalidated device data.  Last Pain:  Vitals:   03/13/19 2200  TempSrc:   PainSc: 0-No pain         Complications: No apparent anesthesia complications

## 2019-03-14 NOTE — Anesthesia Postprocedure Evaluation (Signed)
Anesthesia Post Note  Patient: Laurie Hicks  Procedure(s) Performed: CESAREAN SECTION (N/A )     Patient location during evaluation: PACU Anesthesia Type: Epidural Level of consciousness: oriented and awake and alert Pain management: pain level controlled Vital Signs Assessment: post-procedure vital signs reviewed and stable Respiratory status: spontaneous breathing and respiratory function stable Cardiovascular status: blood pressure returned to baseline and stable Postop Assessment: no headache, no backache, no apparent nausea or vomiting, patient able to bend at knees and epidural receding Anesthetic complications: no    Last Vitals:  Vitals:   03/14/19 0135 03/14/19 0235  BP: 106/63 121/61  Pulse: 89 90  Resp: 18 18  Temp: 37.4 C 36.9 C  SpO2: 97% 96%    Last Pain:  Vitals:   03/14/19 0235  TempSrc: Oral  PainSc: 0-No pain   Pain Goal:                   Pervis Hocking

## 2019-03-14 NOTE — Plan of Care (Signed)
  Problem: Pain Managment: Goal: General experience of comfort will improve Note: Discussed pain medication, scheduled and prn. Also discussed the importance of increasing activity throughout today and begin ambulating in hallway. Maxwell Caul, Leretha Dykes [place]

## 2019-03-14 NOTE — Lactation Note (Signed)
This note was copied from a baby's chart. Lactation Consultation Note  Patient Name: Laurie Hicks [id number] Date: 03/14/2019 Reason for consult: (AMA)  33 hours old ETI female who is being exclusively BF by his mother, she's a P1. Mom reported (+) breast changes during the pregnancy. RN called LC for latch assistance because mom is having a hard time latching baby on and she's not got scabs on both nipples, she was bleeding earlier today. RN Leretha Dykes has already set mom up with a hand pumpg and shells due to areolar edema, he nipples also look semi-flat, very short shafted, she also has coconut oil in her room.  LC offered to set up a DEBP but mom wishes to use her own Spectra she brought from home. LC provided cleaning supplies for her personal pump. Offered assistance with latch and mom agreed to wake baby up to feed, she was doing STS, praised her for her efforts. LC started with some hand expression, mom able to hand express small droplets of colostrum out of both breasts, LC finger fed baby. Noticed that baby has a very tight grip when sucking on a gloved finger.  LC took baby STS to mother's left breast in football position but he wasn't able to latch, baby kept crying. Tried a NS # 24 and baby was able to latch almost right away but required constant stimulation to continue sucking. LC offered mom to exclusively pump for the next 24 hours to let her nipples heal in case latching becomes too painful for her. Mom voiced that feedings were not as painful as before and that this particular feeding felt better. A few audible swallows noted upon breast compressions, baby fed for a total of 16 minutes, colostrum noted on NS at the end of the feeding.   Once baby was done, LC did teach back with mom on how to position NS on her own. Tried two different sizes, and size # 20 seems to have the better fit, size 24 is a bit roomy on her. Mom will try NS # 20 on the next feeding. Reviewed normal newborn  behavior, cluster feeding, feeding cues, prevention/treatment of sore nipples, lactogenesis II and pumping schedule.  Feeding plan:  1. Encouraged mom to put baby to breast 8-12 times/24 hours or sooner if feeding cues are present; using NS # 20 PRN 2. She'll pump every 3 hours after feedings for 15 minutes at a time using coconut oil prior pumping 3. She'll start wearing her breast shells tomorrow morning, daytime only  Parents reported all questions and concerns were answered, they're both aware of [place] OP services and will call PRN.   Maternal Data    Feeding Feeding Type: Breast Fed  LATCH Score Latch: Repeated attempts needed to sustain latch, nipple held in mouth throughout feeding, stimulation needed to elicit sucking reflex.  Audible Swallowing: A few with stimulation(with breast compressions)  Type of Nipple: Everted at rest and after stimulation(very short shafted)  Comfort (Breast/Nipple): Filling, red/small blisters or bruises, mild/mod discomfort(nipples no longer bleeding but mom voice some discomfort, but much better than earlier today)  Hold (Positioning): Assistance needed to correctly position infant at breast and maintain latch.  LATCH Score: 6  Interventions Interventions: Breast feeding basics reviewed;Assisted with latch;Skin to skin;Breast massage;Hand express;Breast compression;Support pillows;Adjust position;Coconut oil;DEBP;Shells  Lactation Tools Discussed/Used Tools: Shells;Pump;Coconut oil;Nipple Shields Nipple shield size: 20;24 Shell Type: Inverted Breast pump type: Double-Electric Breast Pump Pump Review: Setup, frequency, and cleaning Initiated by:: MPeck Date initiated::  03/14/19   Consult Status Consult Status: Follow-up Date: 03/15/19 Follow-up type: In-patient    Salman Wellen Francene Boyers 03/14/2019, 9:43 PM

## 2019-03-14 NOTE — Progress Notes (Signed)
[name] was referred for history of depression/anxiety.  * Referral screened out by Clinical Social Worker because none of the following criteria appear to apply:  ~ History of anxiety/depression during this pregnancy, or of post-partum depression following prior delivery. ~ Diagnosis of anxiety and/or depression within last 3 years. Per chart review, [name]'s anxiety dates back to 2017 and anxiety has been well-controlled this pregnancy without medications.  OR * [name]'s symptoms currently being treated with medication and/or therapy.  Please contact the Clinical Social Worker if needs arise, by [facility] request, or if [name] scores greater than 9/yes to question 10 on [place] Postpartum Depression Screen.  Laurie Hicks, [place]  [facility] and [employer] [phone]

## 2019-03-14 NOTE — Lactation Note (Addendum)
This note was copied from a baby's chart. Lactation Consultation Note  Patient Name: Laurie Hicks [id number] Date: 03/14/2019 Reason for consult: Initial assessment;1st time breastfeeding;Early term 37-38.6wks P1, 4 hour female ETI infant. This is mom's fourth time latching infant to breast. LC entered the room mom was  hunched over and attempting to latch infant to breast without pillow support.  Mom open to [facility] suggestions, pillows used, mom latched infant on right breast using the football hold position, infant latched with wide mouth, tongue down and chin touching breast, swallows observed infant breastfed for 10 minutes. Mom taught back hand expression and infant was given 5 ml of colostrum by spoon. Afterwards infant was still cuing to breastfed,  mom re-latched infant in football hold position and infant was still breastfeeding as [employer] left the room. Mom knows to breastfeed according hunger cues, 8 to 12 times within 24 hours and on demand. Parents will do as much STS as possible. Mom knows to call Nurse or [place] if she has any questions, concerns or need assistance with latching infant to breast, Mom made aware of O/P services, breastfeeding support groups, community resources, and our phone # for post-discharge questions.   Maternal Data Formula Feeding for Exclusion: No Has patient been taught Hand Expression?: Yes(Mom taught back hand expression and infant was given 5 ml of colostrum by spoon.) Does the patient have breastfeeding experience prior to this delivery?: No  Feeding Feeding Type: Breast Fed  LATCH Score Latch: Repeated attempts needed to sustain latch, nipple held in mouth throughout feeding, stimulation needed to elicit sucking reflex.  Audible Swallowing: Spontaneous and intermittent  Type of Nipple: Everted at rest and after stimulation  Comfort (Breast/Nipple): Soft / non-tender  Hold (Positioning): Assistance needed to correctly position infant at breast and  maintain latch.  LATCH Score: 8  Interventions Interventions: Breast feeding basics reviewed;Breast compression;Assisted with latch;Adjust position;Skin to skin;Support pillows;Breast massage;Position options;Hand express;Expressed milk;DEBP  Lactation Tools Discussed/Used WIC Program: No   Consult Status Consult Status: Follow-up Date: 03/14/19 Follow-up type: In-patient    Vicente Serene 03/14/2019, 4:30 AM

## 2019-03-14 NOTE — Progress Notes (Signed)
Subjective: Postpartum Day 1: Cesarean Delivery Patient reports tolerating PO.    Objective: Vital signs in last 24 hours: Temp:  [97.4 F (36.3 C)-99.5 F (37.5 C)] 98.3 F (36.8 C) (11/11 0545) Pulse Rate:  [75-119] 75 (11/11 0545) Resp:  [16-21] 18 (11/11 0545) BP: (92-131)/(44-100) 106/65 (11/11 0545) SpO2:  [95 %-100 %] 96 % (11/11 0235)  Physical Exam:  General: alert, cooperative and no distress Lochia: appropriate Uterine Fundus: firm Incision: healing well DVT Evaluation: No evidence of DVT seen on physical exam.  Recent Labs    03/13/19 1409 03/14/19 0541  HGB 11.7* 10.6*  HCT 35.1* 32.4*    Assessment/Plan: Status post Cesarean section. Doing well postoperatively.  Continue current care.  Shon Millet II 03/14/2019, 8:09 AM

## 2019-03-15 NOTE — Lactation Note (Addendum)
This note was copied from a baby's chart. Lactation Consultation Note  Patient Name: Laurie Hicks [id number] Date: 03/15/2019 Reason for consult: Follow-up assessment;Nipple pain/trauma Baby is 38 hours old/5% weight loss.  Mom states nipples are very sore so she is pumping and spoon feeding to rest nipples.  She gave 8 mls 2 hours ago.  Mom is interested in syringe feeding and will call for assist with next feeding.  Maternal Data    Feeding Feeding Type: Breast Milk  LATCH Score                   Interventions    Lactation Tools Discussed/Used     Consult Status Consult Status: Follow-up Date: 03/16/19 Follow-up type: In-patient    [name] 03/15/2019, 2:31 PM

## 2019-03-15 NOTE — Lactation Note (Signed)
This note was copied from a baby's chart. Lactation Consultation Note Baby 79 hrs old. Mom states baby is feeding about every 1 1/2 -2 hrs. Asked what cluster feeding was, LC explained.  Asked mom if she has painful latches, mom replied yes sometimes. Mom stated she was using NS and it helps a lot.  Asked mom to call for [facility] today for feeding and latch to see what's causing discomfort. Mom stated she would.  Patient Name: Laurie Hicks [id number] Date: 03/15/2019 Reason for consult: Follow-up assessment;Primapara   Maternal Data    Feeding    LATCH Score                   Interventions    Lactation Tools Discussed/Used     Consult Status Consult Status: Follow-up Date: 03/15/19 Follow-up type: In-patient    Theodoro Kalata 03/15/2019, 4:52 AM

## 2019-03-15 NOTE — Lactation Note (Signed)
This note was copied from a baby's chart. Lactation Consultation Note  Patient Name: Laurie Hicks [id number] Date: 03/15/2019 Reason for consult: Follow-up assessment;Primapara;1st time breastfeeding;Infant weight loss;Nipple pain/trauma  11 hours old ETI female who is being exclusively BF by his mother, she's a P1. RN Sharyn Lull asked LC for assistance with the feeding plan, mom is anticipating discharge for tomorrow but baby is still spoon feeding and syringe feeding. She's been consistently pumping every 3 hours and getting about 6 ml per pumping session combined.   She has also been using the NS # 24 on the left breast and the # 20 on the right breast. Mom is still experiencing soreness, she told LC she tried to latch without the NS and the pain was unbearable, but once she put the shield back on again, she was able to finish the feeding. Baby has been taking the breast between 10-30 minutes at the time and mom has noticed colostrum on the NS at the end of the feedings.  Offered assistance with latch but mom politely declined, she said baby has already fed, the last feeding at the breast with the NS was for 30 minutes. Mom reports her soreness is getting better, LC set her up with comfort gels, instructions, cleaning and storage were reviewed.   Parents agree to start supplementing with a bottle and a slow flow nipple prior discharge. LC brought feeding supplies into the room, mom will continue with the great work, she's been working very hard to keep her baby on breastmilk, her goal is exclusivity but will supplement with formula only if medically necessary.   Feeding plan:  1. Encouraged mom to keep put baby to breast 8-12 times/24 hours or sooner if feeding cues are present; using NS # 20 & # 24 PRN 2. She'll pump every 3 hours after feedings for 15 minutes at a time using coconut oil prior pumping 3. She'll start wearing her comfort gels tonight, she's aware of not using coconut oil at  the same time 4. Parents will start supplementing baby with EBM using a bottle with a slow flow nipple prior discharge  Parents agreeable with feeding plan. They reported all questions and concerns were answered, they're both aware of [place] OP services and will call PRN.      Maternal Data    Feeding Feeding Type: Breast Fed  LATCH Score Latch: Repeated attempts needed to sustain latch, nipple held in mouth throughout feeding, stimulation needed to elicit sucking reflex.  Audible Swallowing: A few with stimulation  Type of Nipple: Everted at rest and after stimulation  Comfort (Breast/Nipple): Filling, red/small blisters or bruises, mild/mod discomfort  Hold (Positioning): No assistance needed to correctly position infant at breast.  LATCH Score: 7  Interventions Interventions: Breast feeding basics reviewed;Comfort gels;DEBP  Lactation Tools Discussed/Used Tools: Comfort gels;Pump Breast pump type: Double-Electric Breast Pump   Consult Status Consult Status: Follow-up Date: 03/16/19 Follow-up type: In-patient    Indie Nickerson Francene Boyers 03/15/2019, 10:46 PM

## 2019-03-15 NOTE — Progress Notes (Signed)
POD # 2  Doing well No complaints Having some mild feeding issues  BP 114/77 (BP Location: Right Arm)   Pulse 80   Temp 97.7 F (36.5 C) (Oral)   Resp 16   Ht 5\' 5"  (1.651 m)   Wt 98.4 kg   SpO2 98%   Breastfeeding Unknown   BMI 36.09 kg/m  Abdomen is soft and non tender Bandage clean and dry and intact  POD # 2  Doing well circ today  Discharge home tomorrow

## 2019-03-16 MED ORDER — OXYCODONE-ACETAMINOPHEN 5-325 MG PO TABS: 1.0000 | ORAL_TABLET | ORAL | 0 refills | Status: AC | PRN

## 2019-03-16 MED ORDER — DOCUSATE SODIUM 100 MG PO CAPS: 100.0000 mg | ORAL_CAPSULE | Freq: Two times a day (BID) | ORAL | 2 refills | Status: DC

## 2019-03-16 MED ORDER — IBUPROFEN 600 MG PO TABS: 600.0000 mg | ORAL_TABLET | Freq: Four times a day (QID) | ORAL | 0 refills | Status: DC | PRN

## 2019-03-16 NOTE — Lactation Note (Signed)
This note was copied from a baby's chart. Lactation Consultation Note  Patient Name: Laurie Hicks [id number] Date: 03/16/2019 Reason for consult: Follow-up assessment;Primapara;1st time breastfeeding;Infant weight loss;Other (Comment);Early term 72-38.6wks;Nipple pain/trauma(6% weight loss)  Baby is 78 hours old  Latch Score 8  LC reviewed breastfeeding basics , sore nipple and engorgement prevention and tx .  Per mom has a Pump at home.  Mom has the pamphlet with phone numbers for [facility] services.    Maternal Data Has patient been taught Hand Expression?: Yes  Feeding Feeding Type: Breast Milk  LATCH Score ( Latch Score by LC )  Latch: Grasps breast easily, tongue down, lips flanged, rhythmical sucking.  Audible Swallowing: Spontaneous and intermittent  Type of Nipple: Everted at rest and after stimulation  Comfort (Breast/Nipple): Filling, red/small blisters or bruises, mild/mod discomfort  Hold (Positioning): Assistance needed to correctly position infant at breast and maintain latch.  LATCH Score: 8  Interventions Interventions: Breast feeding basics reviewed;Assisted with latch;Skin to skin;Breast massage;Hand express;Pre-pump if needed;Reverse pressure;Breast compression;Adjust position;Support pillows;Position options;Coconut oil;Shells;Comfort gels;Hand pump  Lactation Tools Discussed/Used Tools: Shells;Pump;Flanges Nipple shield size: 24 Flange Size: 24;27 Shell Type: Inverted Breast pump type: Manual Pump Review: Milk Storage Initiated by:: MAI - reviewed prior to D/C   Consult Status Consult Status: Follow-up Date: ([place] placed a request in the [place] clinic for appt next week) Follow-up type: Out-patient    [place] 03/16/2019, 11:05 AM

## 2019-03-16 NOTE — Discharge Summary (Signed)
Obstetric Discharge Summary Reason for Admission: induction of labor - gestational HTN Prenatal Procedures: none Intrapartum Procedures: cesarean: low cervical, transverse -nrFHT Postpartum Procedures: none Complications-Operative and Postpartum: none Hemoglobin  Date Value Ref Range Status  03/14/2019 10.6 (L) 12.0 - 15.0 g/dL Final   HCT  Date Value Ref Range Status  03/14/2019 32.4 (L) 36.0 - 46.0 % Final    Physical Exam:  General: alert, cooperative and appears stated age 36: appropriate Uterine Fundus: firm Incision: healing well, no significant drainage, no dehiscence, no significant erythema DVT Evaluation: No evidence of DVT seen on physical exam. Negative Homan's sign. No cords or calf tenderness. No significant calf/ankle edema.  Discharge Diagnoses: Term Pregnancy-delivered  Discharge Information: Date: 03/16/2019 Activity: pelvic rest Diet: routine Medications: PNV, Ibuprofen, Colace and oxycodone Condition: stable Instructions: refer to practice specific booklet Discharge to: home   Newborn Data: Live born female  Birth Weight: 7 lb 1.6 oz (3220 g) APGAR: 8, 9  Newborn Delivery   Birth date/time: 03/13/2017 23:33:00 Delivery type: C-Section, Vacuum Assisted Trial of labor: Yes C-section categorization: Primary      Home with mother.  Tyson Dense 03/16/2019, 8:50 AM

## 2019-03-21 ENCOUNTER — Ambulatory Visit: Payer: Self-pay

## 2019-03-21 NOTE — Lactation Note (Addendum)
This note was copied from a baby's chart. Lactation Consultation Note  Patient Name: Laurie Hicks Today's Date: 03/21/2019     03/21/2019  Name: Laurie Hicks MRN: [id number] Date of Birth: 03/13/2017 Gestational Age: Gestational Age: [redacted]w[redacted]d Birth Weight: 113.6 oz Weight today:    6 pounds 13.9 ounces (3116 grams) in a clean newborn diaper  79 day old ET infant presents today with mom and dad for feeding assessment. Infant was fed for about 15 minutes prior to appt.   Infant has gained 105 grams in the last 5 days with an average daily weight gain of 21 grams a day. Infant lowest weight was day of discharge.   Infant is self awakening for some feeds and parents awaken infant to feed for other feeds. Infant has difficulty with falling asleep after feeding.   Mom was latching to one breast for 20 minutes with the NS and then bottle feeding. Mom reports in the last 2 days infant has been  feeding on both breasts with no supplement given.   Mom currently not pumping, she stopped yesterday morning at 08:30. Discussed with mom that as long as infant using the NS, it is recommended that she pump 3-4 times a day until infant shows consistent growth.   Mom with scabbed nipples. Nipples with open areas. Mom with a lot of pain with feeding, especially on the left breast. She is using the Nipple Shield with each feeding.   Infant was using the [place] bottles and did well per parents. They report no choking or drooling. Dad paces infant and burps frequently, infant spits more with bottle feeding.   Infant with thick labial frenulum that inserts at the bottom of the gum ridge. Upper lip very tight with flanging. Upper lip tight and not flanging well on the breast. Infant with tight posterior lingual frenulum. Infant with good tongue lateralization and extension. Infant with some decreased mid tongue elevation. Infant with strong suckle on gloved finger. Mom using the NS  with each feeding, left breast is painful throughout feeding and right nipple painful with latch. Infant sleepy at the breast although is an ET infant. Would like to reassess at the next feeding. Parents informed about tongue and lip restrictions and website and local provider information given.   Infant to follow up Dr, Teryl Lucy on 03/22/19. Infant to follow up with Lactation in 1-2 weeks.     General Information: Mother's reason for visit: Feeding assessment, ET infant Consult: Initial Lactation consultant: Ivin Booty Tashauna Caisse RN,IBCLC Breastfeeding experience: Feeding every 3 hours at the breast, stopped supplement 2 days ago Maternal medical conditions: Pregnancy induced hypertension Maternal medications: Pre-natal vitamin, Motrin (ibuprofen)  Breastfeeding History: Frequency of breast feeding: every 3 hours Duration of feeding: 20-30 minutes  Supplementation: Supplement method: bottle([place], no choking or drooling)               Pump type: Spectra(28 flanges) Pump frequency: not pumping currently Pump volume: previously 30-60  Infant Output Assessment: Voids per 24 hours: 9 Urine color: Clear yellow Stools per 24 hours: 7 Stool color: Yellow  Breast Assessment: Breast: Filling, Compressible, Hyperplasia Nipple: Erect, Reddened, Scabs, Blister Pain level: 2(left breast 2, right breast 6-7) Pain interventions: Bra, Coconut oil, Expressed breast milk, Inverted shells, Breast pump, Nipple shield  Feeding Assessment: Infant oral assessment: Variance Infant oral assessment comment: see note Positioning: Football(right breast, 15 minutes) Latch: 1 - Repeated attempts needed to sustain latch, nipple held in mouth throughout feeding, stimulation needed  to elicit sucking reflex.   Type of nipple: 2 - Everted at rest and after stimulation Comfort: 0 - Engorged, cracked, bleeding, large blisters, severe discomfort Hold: 2 - No assistance needed to correctly position infant at  breast LATCH score: 5 Latch assessment: Deep Lips flanged: No(upper lip very tight) Suck assessment: Displays both Tools: Nipple shield 24 mm Pre-feed weight: 3116 grams Post feed weight: 3142 grams Amount transferred: 26 ml Amount supplemented: 0  Additional Feeding Assessment: Infant oral assessment: Variance Infant oral assessment comment: see note Positioning: Football(left breast, 5 minutes) Latch: 2 - Grasps breast easily, tongue down, lips flanged, rhythmical sucking. Audible swallowing: 1 - A few with stimulation Type of nipple: 2 - Everted at rest and after stimulation Comfort: 1 - Filling, red/small blisters or bruises, mild/mod discomfort Hold: 2 - No assistance needed to correctly position infant at breast LATCH score: 8 Latch assessment: Deep Lips flanged: No Suck assessment: Displays both Tools: Nipple shield 24 mm Pre-feed weight: 3142 grams Post feed weight: 3142 grams Amount transferred: 0    Totals: Total amount transferred: 26 ml Total supplement given: fed prior to coming for appt Total amount pumped post feed: did not pump   Plan:   1. Offer infant the breast with feeding cues 2. Limit breast feeding to 20 minutes if infant is sleepy at the breast 3. Make sure infant gets 8 feedings in 24 hours.  4. Awaken infant at 3 hours to feed if infant not awakening on his own until he is starting to awaken on his own 5. Use the # 24 Nipple Shield with feeding as needed to maintain latch. As he gets bigger and better at breast feeding, then we will try to wean it off.  6. Empty one breast before offering the second breast 7. Offer infant a bottle of pumped milk if he is still cueing to feed after breast feeding 8. When feeding with a bottle, offer it to infant in the paced bottle feeding method (video on [email or web address]) 9. When using the bottle, use a slow flow nipple 10. Infant needs about 58-78 ml (2-2.5 ounces) for 8 feedings a day or 465-620 ml (16-21 ounces)  in 24 hours. Infant may take more or less at each feeding depending on how often he feeds.  11. Would recommend that you pump about 3-4 times a day after breast feeding to empty the breasts and protect milk supply while using the Nipple Shield. Will wean once infant is more consistently emptying the breast and weans off the nipple shield 12. All Purpose Nipple Ointment may be helpful to healing your nipples. Call OB to request 13. Keep up the good work 56. Thank you for allowing me to assist you today 15. Please call with any questions or concerns as needed [phone] 16. Follow up with Lactation 1-2 weeks   Donn Pierini RN, IBCLC                                                    Donn Pierini 03/21/2019, 8:31 AM

## 2019-04-04 ENCOUNTER — Ambulatory Visit: Payer: Self-pay

## 2019-04-04 NOTE — Lactation Note (Addendum)
This note was copied from a baby's chart. Lactation Consultation Note  Patient Name: Laurie Hicks [id number] Date: 04/04/2019     04/04/2019  Name: Laurie Hicks MRN: [id number] Date of Birth: 03/13/2017 Gestational Age: Gestational Age: [redacted]w[redacted]d Birth Weight: 113.6 oz Weight today:  Weight: 7 lb 10.2 oz (1079 g)   38 week old ET infant presents today with mom for follow up feeding assessment. Infant was 40 weeks on 11/28.   Infant has gained 318 grams in the last 14 days with an average daily weight gain of 23 grams a day. Reviewed offering supplement any time infant is still hungry post BF.  Mom reports her nipples are better and that infant is feeding well. Infant is spitting more per mom. Mom is burping infant well and is keeping infant upright after feedings. Reviewed emptying one breast before offering the second breast.   Infant is feeding every 1.5- 3 hours. Infant is nursing on both breasts with each feeding. Infant self awakens to feed, mom sometimes has to awaken infant to feed at about 3.5 hours. Infant is feeding for 20-30 minutes and falls asleep.  Informed mom she can allow infant to awaken himself since he is over his birth weight. Infant is sometimes hungry after BF in the last 24 hours. He seems to be having a growth spurt. Mom relatches as needed and offers a bottle as needed.   Mom is using the # 24 NS with feeding. Mom reports non disposable breast pads stick to her and the disposable itch her really bad and has to remove immediately. She is weaning off the coconut oil. Mom reports she has some sensitive skin at times but no known allergies. She has tried the [employer] and has just ordered the Organic ones. Enc her to try cotton ones. She is allergic to [employer] Laundry and is washing her breast pads in All Free and Clear that she uses on all her clothing. Mom and infant without signs of yeast/thrush.   Infant with thick labial frenulum that  inserts at the bottom of the gum ridge. Upper lip very tight with flanging. Upper lip tight and not flanging well on the breast. Infant with tight posterior lingual frenulum. Infant with good tongue lateralization and extension. Infant with some decreased mid tongue elevation. Infant with strong suckle on gloved finger. Mom using the NS with each feeding, left breast is painful throughout feeding and right nipple painful with latch. Infant sleepy at the breast although is an ET infant. Parents informed about tongue and lip restrictions and website and local provider information given. Parents to decide if they would like to have infant evaluated. Reviewed continuing to pump 3-4 x a day to protect milk supply until we are sure infant is continuing to gain well.   Infant is getting a bottle every other day and taking 2.5-3 ounces. He is not choking or drooling on the [place] [employer] bottles. Reviewed feeding in paced bottle feeding method. Mom to try the Dr. Saul Fordyce narrow based feeding.   Infant latched to the left breast in the football hold. Infant fed with the # 24 NS for about 10 minutes. Infant then latched to the breast without the NS. Upper lip very tight on the breast and needs flanging on the breast. Mom with some discomfort with feeding and nipple compressed and asymmetrical post feeding. We then latched infant to the right breast without the NS and infant latched for about 5 minutes. Infant swallows better  when mom compresses.  Nipple with blanching when infant released. Infant was then burped and relatched to the right breast with the # 24 NS. Infant fed intermittently on the breast and self released.   Infant to follow up with Dr. Teryl Lucy at 2 months. Infant to follow up with Lactation as needed. Call back if not able to wean off the Nipple Shield or 1-5 days post tongue and lip restristions have been released.       General Information: Mother's reason for visit: follow up feeding  assessment Consult: Follow-up Lactation consultant: Nonah Mattes RN,IBCLC Breastfeeding experience: nursing every 1-3 hours, using the # 24 NS, supplementing some Maternal medical conditions: Pregnancy induced hypertension Maternal medications: Pre-natal vitamin  Breastfeeding History: Frequency of breast feeding: every 1-2 hours Duration of feeding: 20-30 minutes  Supplementation: Supplement method: bottle([place] [employer])         Breast milk volume: 2.5-3 Breast milk frequency: about once a day   Pump type: Spectra Pump frequency: 2 x a day Pump volume: 3-4 ounces  Infant Output Assessment: Voids per 24 hours: 8+ Urine color: Clear yellow Stools per 24 hours: 5-6 Stool color: Yellow  Breast Assessment: Breast: Soft, Compressible, Hyperplasia Nipple: Erect Pain level: 1 Pain interventions: Bra, Coconut oil, All purpose nipple cream, Inverted shells, Breast pump, Nipple shield  Feeding Assessment: Infant oral assessment: Variance Infant oral assessment comment: see note Positioning: Football(left breast, 10 minutes) Latch: 1 - Repeated attempts needed to sustain latch, nipple held in mouth throughout feeding, stimulation needed to elicit sucking reflex. Audible swallowing: 2 - Spontaneous and intermittent Type of nipple: 2 - Everted at rest and after stimulation Comfort: 1 - Filling, red/small blisters or bruises, mild/mod discomfort Hold: 2 - No assistance needed to correctly position infant at breast LATCH score: 8 Latch assessment: Deep Lips flanged: No Suck assessment: Displays both Tools: Nipple shield 24 mm Pre-feed weight: 3434 grams Post feed weight: 3464 grams Amount transferred: 30 ml Amount supplemented: 0  Additional Feeding Assessment: Infant oral assessment: Variance Infant oral assessment comment: see note Positioning: Football(right breast, 10 minues) Latch: 1 - Repeated attempts neede to sustain latch, nipple held in mouth throughout feeding,  stimulation needed to elicit sucking reflex. Audible swallowing: 2 - Spontaneous and intermittent   Comfort: 1 - Filling, red/small blisters or bruises, mild/mod discomfort Hold: 2 - No assistance needed to correctly position infant at breast LATCH score: 6 Latch assessment: Deep Lips flanged: No Suck assessment: Displays both Tools: Nipple shield 24 mm Pre-feed weight: 3464 grams Post feed weight: 3484 grams Amount transferred: 20 ml Amount supplemented: 0  Totals: Total amount transferred: 50 ml Total supplement given: 0 Total amount pumped post feed: did not pump   Plan:  1. Offer infant the breast with feeding cues 2. Limit breast feeding to 20 minutes if infant is sleepy at the breast 3. Make sure infant gets 8 feedings in 24 hours.  4. Awaken infant at 3 hours to feed if infant not awakening on his own until he is starting to awaken on his own 5. Use the # 24 Nipple Shield with feeding as needed to maintain latch. As he gets bigger and better at breast feeding, then we will try to wean it off. Try each day without the Nipple Shield to see when infant is able to do without it 6. Empty one breast before offering the second breast 7. Offer infant a bottle of pumped milk if he is still cueing to feed after breast feeding  8. When feeding with a bottle, offer it to infant in the paced bottle feeding method (video on [email or web address]) 9. When using the bottle, use a slow flow nipple 10. Infant needs about 64-85 ml (2-3 ounces) for 8 feedings a day or 510-660ml (17-23 ounces) in 24 hours. Infant may take more or less at each feeding depending on how often he feeds.  11. Would recommend that you pump about 3-4 times a day after breast feeding to empty the breasts and protect milk supply while using the Nipple Shield. Will wean once infant is more consistently emptying the breast and weans off the nipple shield 13. Keep up the good work 15. Thank you for allowing me to assist you today 15.  Please call with any questions or concerns as needed [phone] 16. Follow up with Lactation as needed. Call back if not able to wean off the Nipple Shield or 1-5 days post tongue and lip restristions have been released.     Debby Freiberg Tashina Credit RN, IBCLC                                                       Debby Freiberg Ebonie Westerlund 04/04/2019, 9:25 AM

## 2019-05-08 ENCOUNTER — Ambulatory Visit: Payer: Self-pay

## 2019-05-08 NOTE — Lactation Note (Signed)
This note was copied from a baby's chart. Lactation Consultation Note  Patient Name: Laurie Hicks Today's Date: 05/08/2019     05/08/2019  Name: Shantea Ludovico MRN: [id number] Date of Birth: 03/13/2017 Gestational Age: Gestational Age: [redacted]w[redacted]d Birth Weight: 113.6 oz Weight today:  Weight: 10 lb 0.3 oz (6434 g)   70 week old infant presents today with mom for follow up feeding assessment. Mom reports infant is feeding well at the breast and still needing to use the NS. Mom reports BF is painful when she does not use the NS and infant is not able to sustain latch well.   Infant has gained 1080 grams in the last 34 days with an average daily weight gain of 32 grams a day.   Mom reports infant has periods of gassiness and arching at the breast, not always. Infant is feeding for less time at the breast. He is spitting some, mainly in the morning. Mom offers both breasts with each feeding, infant is starting to only want to take one breast at times.   Infant is still getting a bottle about once a day and is paced fed. Infant tolerating bottle feeding well.   Right breast still produces less then the left (about 1/2 of the volume).   Infant with thick labial frenulum that inserts at the bottom of the gum ridge. Upper lip very tight with flanging. Upper lip tight and not flanging well on the breast. Infant with tight posterior lingual frenulum. Infant with good tongue lateralization and extension. Infant with some decreased mid tongue elevation. Infant with hump to back of tongue with suckling.  Infant with strong suckle on gloved finger. Mom using the NS with each feeding, infant not able to sustain latch without the NS.  Infant has an appt with Dr. Verdene Lennert in early February.   Infant does seem to have some reflux type symptoms with and after feeding. Infant arching with feeding and spitting on his back after feeding. Infant fussy at the breast throughout feeding.   Mom to  return to work next week. Infant to accompany mom.   Infant to follow up with Dr. Teryl Lucy next week. Infant to see Dr. Verdene Lennert in February. Infant to follow up with Lactation post tongue and lip releases if completed.       General Information: Mother's reason for visit: Feeding assessment Consult: Follow-up Lactation consultant: Nonah Mattes RN,IBCLC Breastfeeding experience: Feeding has improved, still using the NS with feedings Maternal medical conditions: Pregnancy induced hypertension Maternal medications: Pre-natal vitamin, Other(Zoloft, Starting the Progesterone only pill)  Breastfeeding History: Frequency of breast feeding: Every 2.5-3 hours during the day and every 3-4 hours at night Duration of feeding: 20-40 minutes  Supplementation: Supplement method: bottle(Dr. Brown's)         Breast milk volume: 4 ounces Breast milk frequency: 1 x a day Total breast milk volume per day: 4 ounces Pump type: Spectra Pump frequency: 2 x a day post feeding and once when infant gets a bottle, Pump volume: 3-5 ounces  Infant Output Assessment: Voids per 24 hours: 8+ Urine color: Clear yellow Stools per 24 hours: 3-4 Stool color: Yellow  Breast Assessment: Breast: Soft, Compressible Nipple: Erect Pain level: 1(With initial latch) Pain interventions: Bra, Other, Breast pump, Nipple shield([employer] Nipple Butter with pumping)  Feeding Assessment: Infant oral assessment: Variance Infant oral assessment comment: see note Positioning: Cradle Latch: 2 - Grasps breast easily, tongue down, lips flanged, rhythmical sucking. Audible swallowing: 2 - Spontaneous and  intermittent Type of nipple: 2 - Everted at rest and after stimulation Comfort: 1 - Filling, red/small blisters or bruises, mild/mod discomfort Hold: 2 - No assistance needed to correctly position infant at breast LATCH score: 9 Latch assessment: Deep Lips flanged: Yes Suck assessment: Displays both Tools: Nipple  shield 24 mm Pre-feed weight: 4544 grams Post feed weight: 4590 grams Amount transferred: 46 ml Amount supplemented: infant fed on one breast before appt  Additional Feeding Assessment:                                    Totals: Total amount transferred: 46 ml Total supplement given: fed on one breast before feeding Total amount pumped post feed: did not pump   Plan:   1. Offer infant the breast with feeding cues 2. Awaken infant at 3 hours to feed if infant not awakening on his own until he is starting to awaken on his own 3. Use the # 24 Nipple Shield with feeding as needed to maintain latch. As he gets bigger and better at breast feeding, then we will try to wean it off. Try each day without the Nipple Shield to see when infant is able to do without it 4. Empty one breast before offering the second breast 5. Keep infant upright after feeding for about 15-20 minutes to help with spitting and burping 6. When feeding with a bottle, offer it to infant in the paced bottle feeding method (video on [email or web address]) 7. Infant needs about 85-113 ml (3-4 ounces) for 8 feedings a day or 675-900 ml (23-30 ounces) in 24 hours. Infant may take more or less at each feeding depending on how often he feeds.  8. Would recommend that you pump about 3-4 times a day after breast feeding to empty the breasts and protect milk supply while using the Nipple Shield. Will wean once infant is more consistently emptying the breast and weans off the nipple shield 9. Keep up the good work 25. Thank you for allowing me to assist you today 11. Please call with any questions or concerns as needed [phone] 12. Follow up with Lactation 1-5 days post tongue and lip restristions have been released.     Debby Freiberg Starlene Consuegra RN, IBCLC                                                   Debby Freiberg Arvie Bartholomew 05/08/2019, 8:27 AM

## 2019-05-18 ENCOUNTER — Ambulatory Visit: Payer: Self-pay

## 2019-05-18 NOTE — Lactation Note (Signed)
This note was copied from a baby's chart. Lactation Consultation Note  Patient Name: Dorethea Clan Kucinski Today's Date: 05/18/2019     05/18/2019  Name: Zeyla Plageman MRN: [id number] Date of Birth: 03/13/2017 Gestational Age: Gestational Age: [redacted]w[redacted]d Birth Weight: 113.6 oz Weight today:  Weight: 10 lb 4.7 oz (4268 g)   21 month old infant presents today with mom for follow up post tongue and lip releases on 1/14 by Dr. Verdene Lennert.   Infant has gained 126 grams in the last 10 days with an average daily weight gain of 13 grams a day.   Infant has been a little fussy. He is getting Tylenol. Mom has tried without the NS and reports it is still pretty painful. She reports the pain is worse on the right breast.   Mom reports infants burps are better. Infant on Pepcid and seems to be in less pain. He is still spitting up some.   Infant with granulation tissue to upper lip. Upper lip tight with flanging. Infant with diamond shaped granulation tissue under tongue, tongue with more stretching. Infant still arching and unsettled at the breast. Infant choked once on the breast. Infant with some spitting. Mom doing stretches per Dr. Verdene Lennert. Suck training taught and enc mom to do as directed.   Reviewed with mom that it will take 2-4 weeks for healing and to see a good improvement with feedings. Infant does seem to like to turn to his right, enc tummy time and positioning so he is looking to the left.   Infant to follow up with Dr. Teryl Lucy this afternoon. Infant to follow up with Lactation as needed.      General Information: Mother's reason for visit: Follow up feeding assessment post tongue and lip releases on 1/14 Consult: Follow-up Lactation consultant: Nonah Mattes RN,IBCLC Breastfeeding experience: latching mostly with the NS, can feed some without them Maternal medical conditions: Pregnancy induced hypertension, Other(Zoloft, Progesterone only BCP) Maternal medications:  Pre-natal vitamin  Breastfeeding History: Frequency of breast feeding: every 2.5- 3hours during the day and every 4-5 hours at night Duration of feeding: 15+ minutes  Supplementation: Supplement method: bottle         Breast milk volume: occasional     Pump type: Spectra Pump frequency: 2 x a day Pump volume: 4 ounces  Infant Output Assessment: Voids per 24 hours: 8+ Urine color: Clear yellow Stools per 24 hours: 2-3 Stool color: Yellow  Breast Assessment: Breast: Soft, Compressible Nipple: Erect Pain level: 1 Pain interventions: Bra, Nipple shield, Breast pump  Feeding Assessment: Infant oral assessment: Variance Infant oral assessment comment: see note Positioning: Cross cradle(left breast) Latch: 2 - Grasps breast easily, tongue down, lips flanged, rhythmical sucking. Audible swallowing: 2 - Spontaneous and intermittent Type of nipple: 2 - Everted at rest and after stimulation Comfort: 1 - Filling, red/small blisters or bruises, mild/mod discomfort Hold: 2 - No assistance needed to correctly position infant at breast LATCH score: 9 Latch assessment: Deep Lips flanged: Yes Suck assessment: Displays both   Pre-feed weight: 4670 grams Post feed weight: 4794 grams Amount transferred: 124 ml Amount supplemented: 0  Additional Feeding Assessment:                                    Totals: Total amount transferred: 124 ml Total supplement given: 0 Total amount pumped post feed: did not pump   Plan:  1. Offer infant the  breast with feeding cues 2. Use the # 24 Nipple Shield with feeding as needed to maintain latch. As he gets bigger and better at breast feeding, then we will try to wean it off. Try each day without the Nipple Shield to see when infant is able to do without it 3. Empty one breast before offering the second breast 4. Keep infant upright after feeding for about 15-20 minutes to help with spitting and burping 5. When feeding with  a bottle, offer it to infant in the paced bottle feeding method (video on [email or web address]) 6. Infant needs about 86-115 ml (3-4 ounces) for 8 feedings a day or 690-920 ml (23-31 ounces) in 24 hours. Infant may take more or less at each feeding depending on how often he feeds.  7. Would recommend that you pump about 3-4 times a day after breast feeding to empty the breasts and protect milk supply while using the Nipple Shield. Will wean once infant is more consistently emptying the breast and weans off the nipple shield 9. Continue stretches per Dr. Verdene Lennert 10. Suck training exercises 5-6 times a day for 1-2 minutes per exercise for 2-3 weeks or until suck improves 11. Keep up the good work 21. Thank you for allowing me to assist you today 13. Please call with any questions or concerns as needed [phone] 14. Follow up with Lactation as needed    Donn Pierini RN, IBCLC                                                      Debby Freiberg Adiel Erney 05/18/2019, 8:28 AM

## 2019-11-08 IMAGING — US US EXTREM LOW*L* LIMITED
1 series · 12 of 12 positions shown · non-contrast
Comparison: Radiographs from 03/23/2018

CLINICAL DATA: Left foot pain, potential neuroma

EXAM:
ULTRASOUND left LOWER EXTREMITY LIMITED
TECHNIQUE: Ultrasound examination of the lower extremity soft tissues was
performed in the area of clinical concern.

[Series 1: us extrem low*left* limited · 0.06mm/px · 12 of 12 slices shown]
[im 1/12]
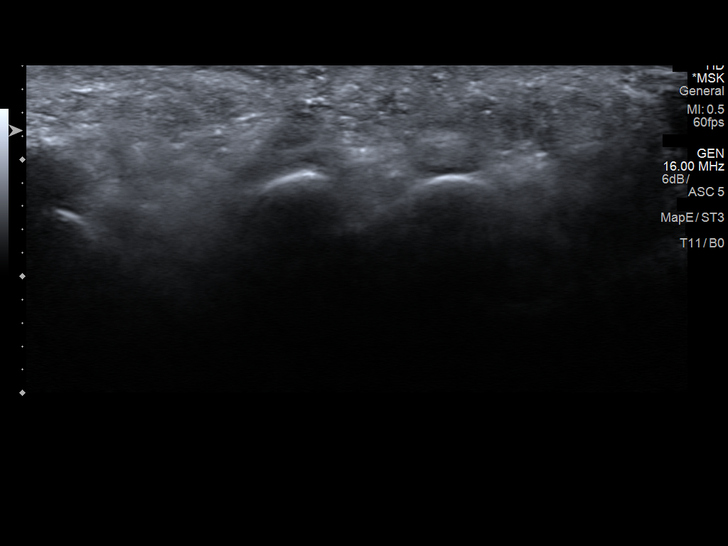
[im 2/12]
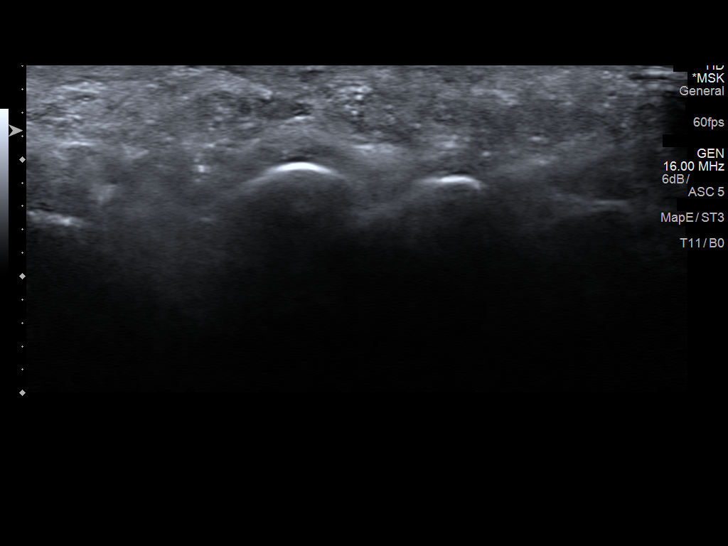
[im 3/12]
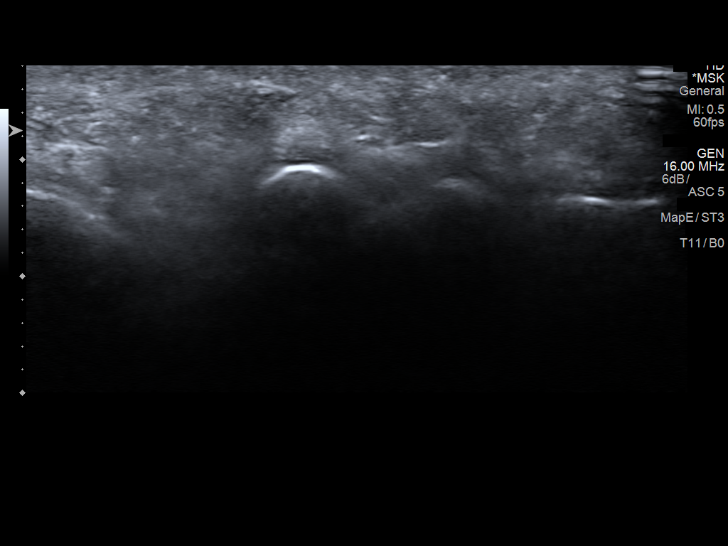
[im 4/12]
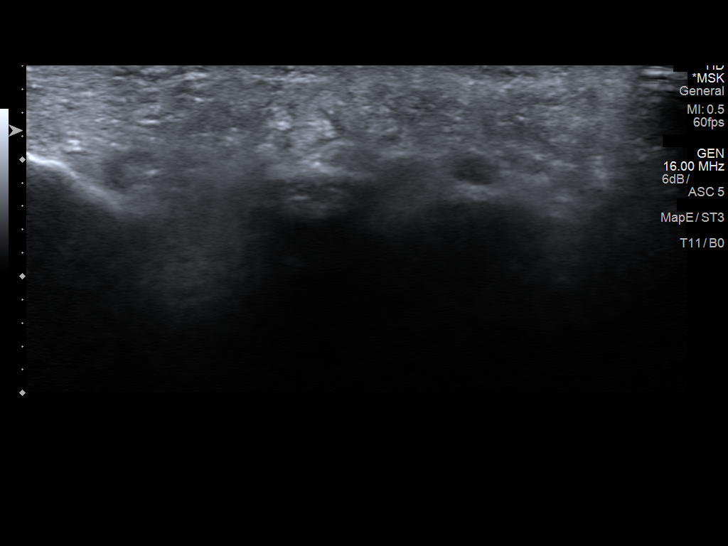
[im 5/12]
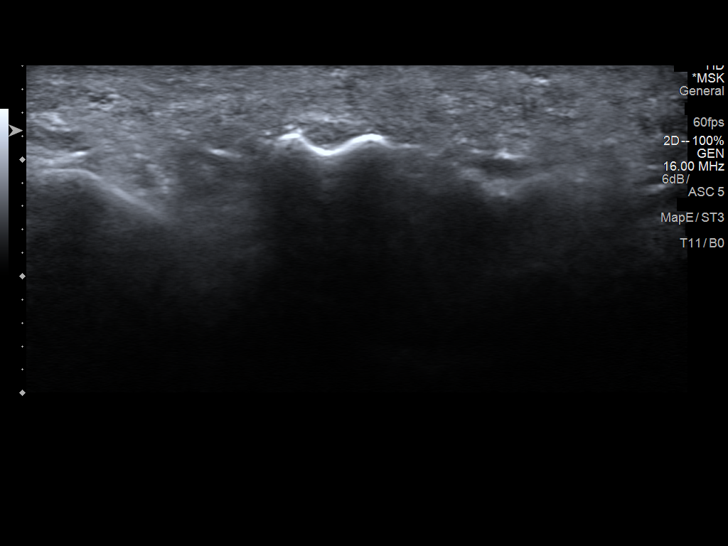
[im 6/12]
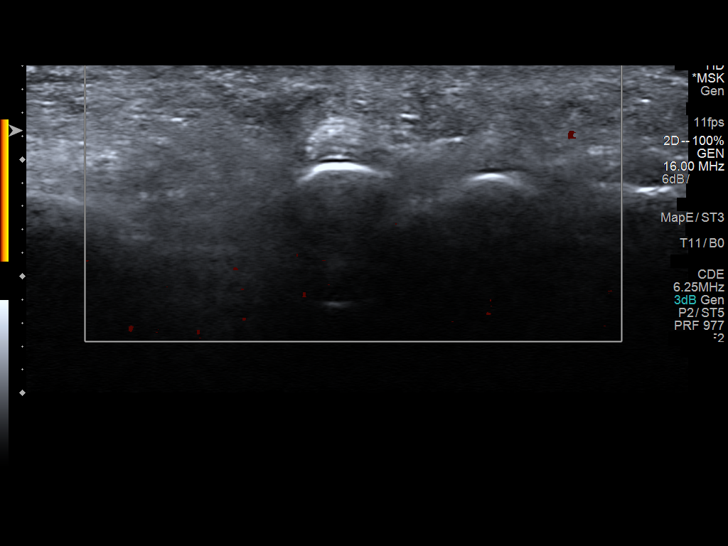
[im 7/12]
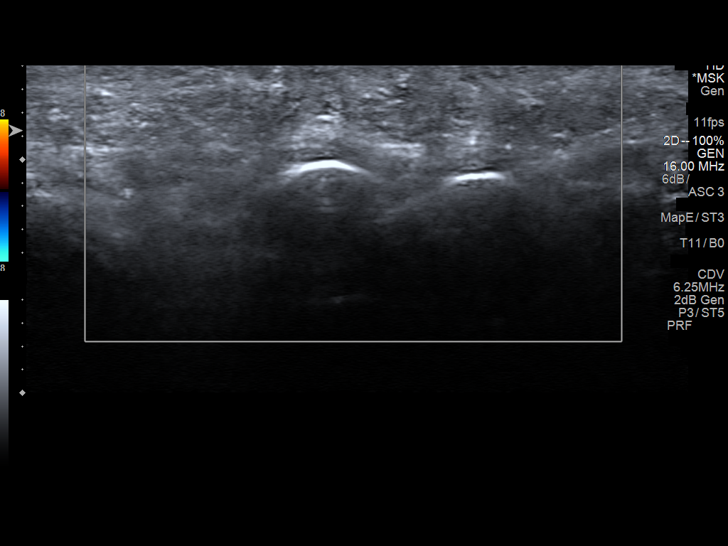
[im 8/12]
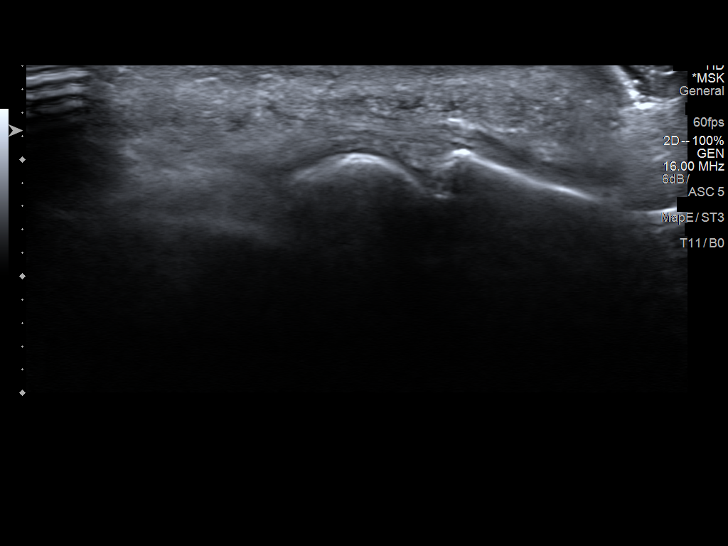
[im 9/12]
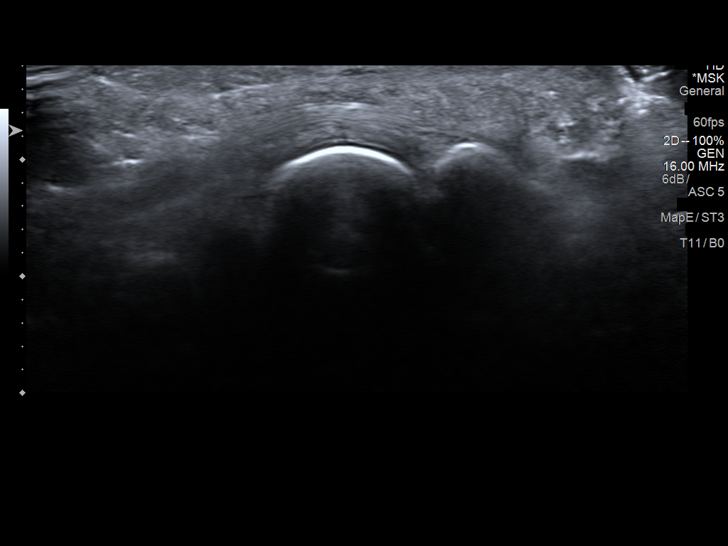
[im 10/12]
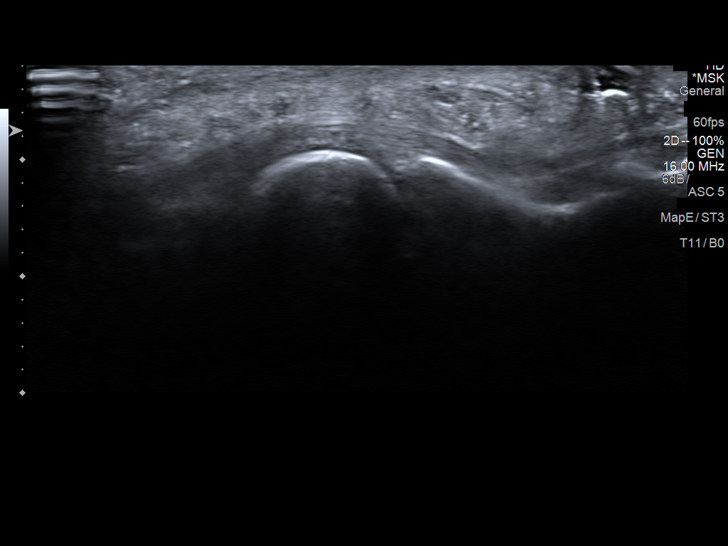
[im 11/12]
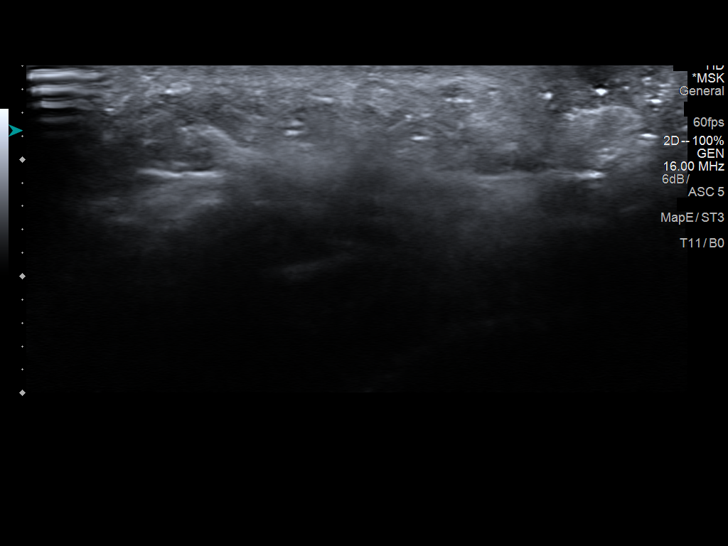
[im 12/12]
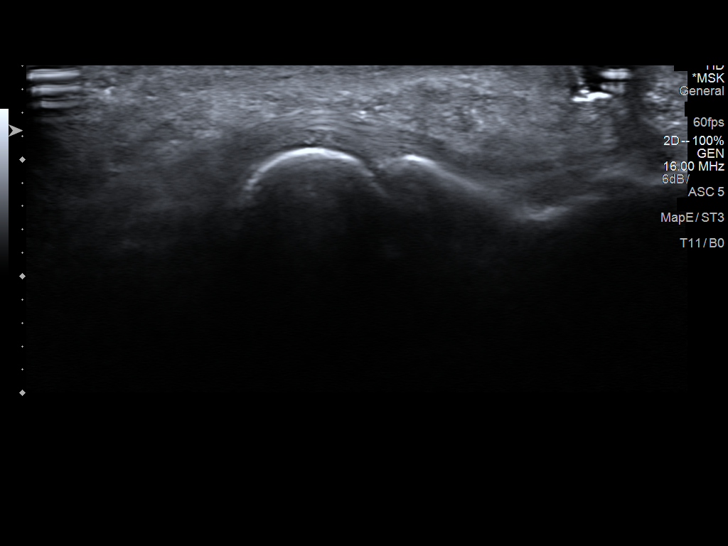

[12 of 12 positions shown; findings below may reference images not displayed]

FINDINGS: Sonography of the plantar region was performed in the vicinity of
the metatarsal heads and appears to depict a 1.4 cm in diameter mass
lesion on image 7 of the first series between structures reported to
me as the heads of the second and third metatarsals.
IMPRESSION: 1. Ultrasound suggests a 1.4 cm mass between the second and third
metatarsal heads extending into the plantar subcutaneous tissues.
The appearance favors Morton neuroma. If surgery is to be performed
I would suggest confirming location and appearance by MRI
beforehand.

## 2020-04-04 LAB — OB RESULTS CONSOLE GC/CHLAMYDIA
Chlamydia: NEGATIVE
Gonorrhea: NEGATIVE

## 2020-04-04 LAB — OB RESULTS CONSOLE HEPATITIS B SURFACE ANTIGEN: Hepatitis B Surface Ag: NEGATIVE

## 2020-04-04 LAB — OB RESULTS CONSOLE RPR: RPR: NONREACTIVE

## 2020-04-04 LAB — OB RESULTS CONSOLE ABO/RH: RH Type: POSITIVE

## 2020-04-04 LAB — OB RESULTS CONSOLE HIV ANTIBODY (ROUTINE TESTING): HIV: NONREACTIVE

## 2020-04-04 LAB — OB RESULTS CONSOLE RUBELLA ANTIBODY, IGM: Rubella: IMMUNE

## 2020-04-04 LAB — OB RESULTS CONSOLE ANTIBODY SCREEN: Antibody Screen: NEGATIVE

## 2020-10-28 ENCOUNTER — Encounter (HOSPITAL_COMMUNITY): Payer: Self-pay | Admitting: *Deleted

## 2020-10-28 NOTE — Patient Instructions (Signed)
Laurie Hicks  10/28/2020   Your procedure is scheduled on:  11/07/2020  Arrive at [facility] at Entrance C on [address] at [facility]  and [address]. You are invited to use the FREE valet parking or use the Visitor's parking deck.  Pick up the phone at the desk and dial [phone].  Call this number if you have problems the morning of surgery: [phone]  Remember:   Do not eat food:(After Midnight) Desps de medianoche.  Do not drink clear liquids: (After Midnight) Desps de medianoche.  Take these medicines the morning of surgery with A SIP OF WATER:  none   Do not wear jewelry, make-up or nail polish.  Do not wear lotions, powders, or perfumes. Do not wear deodorant.  Do not shave 48 hours prior to surgery.  Do not bring valuables to the hospital.  [facility] is not   responsible for any belongings or valuables brought to the hospital.  Contacts, dentures or bridgework may not be worn into surgery.  Leave suitcase in the car. After surgery it may be brought to your room.  For patients admitted to the hospital, checkout time is 11:00 AM the day of              discharge.      Please read over the following fact sheets that you were given:     Preparing for Surgery

## 2020-10-29 ENCOUNTER — Encounter (HOSPITAL_COMMUNITY): Payer: Self-pay

## 2020-10-30 ENCOUNTER — Encounter (HOSPITAL_COMMUNITY): Payer: Self-pay

## 2020-10-31 NOTE — H&P (Signed)
Laurie Hicks is a 40 y.o. female G3P1011 at 52 weeks presenting for repeat C/S.  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  Patient is AMA with low risk NIPS.  Patient has h/o Pre-E and has taken low dose ASA this pregnancy.  GBS negative.   OB History     Gravida  3   Para  1   Term  1   Preterm      AB  1   Living  1      SAB  1   IAB      Ectopic      Multiple  0   Live Births  1          Past Medical History:  Diagnosis Date   Anxiety    Depression    Past Surgical History:  Procedure Laterality Date   CESAREAN SECTION N/A 03/13/2019   Procedure: CESAREAN SECTION;  Surgeon: Linda Hedges, DO;  Location: [facility] LD ORS;  Service: Obstetrics;  Laterality: N/A;   FOOT SURGERY     Family History: family history includes Heart disease in her maternal grandfather and paternal grandfather; Hypertension in her mother. Social History:  reports that she has never smoked. She has never used smokeless tobacco. She reports previous alcohol use. She reports that she does not use drugs.     Maternal Diabetes: No Genetic Screening: Normal Maternal Ultrasounds/Referrals: Normal Fetal Ultrasounds or other Referrals:  None Maternal Substance Abuse:  No Significant Maternal Medications:  Meds include: Zoloft Significant Maternal Lab Results:  Group B Strep negative Other Comments:  None  Review of Systems Maternal Medical History:  Prenatal complications: no prenatal complications Prenatal Complications - Diabetes: none.    unknown if currently breastfeeding. Maternal Exam:  Abdomen: Patient reports no abdominal tenderness. Surgical scars: low transverse.   Fundal height is c/w dates.   Estimated fetal weight is 8#.    Physical Exam Constitutional:      Appearance: Normal appearance.  HENT:     Head: Normocephalic and atraumatic.  Pulmonary:     Effort: Pulmonary effort is normal.  Abdominal:     Palpations:  Abdomen is soft.  Musculoskeletal:        General: Normal range of motion.     Cervical back: Normal range of motion.  Skin:    General: Skin is warm and dry.  Neurological:     Mental Status: She is alert and oriented to person, place, and time.  Psychiatric:        Mood and Affect: Mood normal.        Behavior: Behavior normal.    Prenatal labs: ABO, Rh: O/Positive/-- (12/03 0000) Antibody: Negative (12/03 0000) Rubella: Immune (12/03 0000) RPR: Nonreactive (12/03 0000)  HBsAg: Negative (12/03 0000)  HIV: Non-reactive (12/03 0000)  GBS:   Negative  Assessment/Plan: 40yo G3P1011 at 39 weeks for repeat C/S Patient has been counseled re: risk of bleeding, infection, scarring, and damage to surrounding structures.  She has been counseled re: risk of uterine rupture and abnormal placentation.  All questions were answered and the patient wishes to proceed.   Linda Hedges 10/31/2020, 6:47 PM

## 2020-11-05 ENCOUNTER — Inpatient Hospital Stay (HOSPITAL_COMMUNITY)
Admission: AD | Admit: 2020-11-05 | Discharge: 2020-11-07 | DRG: 788 | Disposition: A | Discharge status: Home / Self Care | Payer: BC Managed Care – PPO | Attending: Obstetrics & Gynecology | Admitting: Obstetrics & Gynecology

## 2020-11-05 ENCOUNTER — Inpatient Hospital Stay (HOSPITAL_COMMUNITY): Payer: BC Managed Care – PPO | Admitting: Anesthesiology

## 2020-11-05 ENCOUNTER — Encounter (HOSPITAL_COMMUNITY)
Admission: AD | Disposition: A | Discharge status: Home / Self Care | Payer: Self-pay | Source: Home / Self Care | Attending: Obstetrics & Gynecology

## 2020-11-05 ENCOUNTER — Other Ambulatory Visit: Payer: Self-pay

## 2020-11-05 ENCOUNTER — Encounter (HOSPITAL_COMMUNITY)
Admission: RE | Admit: 2020-11-05 | Discharge: 2020-11-05 | Disposition: A | Discharge status: Home / Self Care | Payer: BC Managed Care – PPO | Source: Ambulatory Visit | Attending: Obstetrics & Gynecology | Admitting: Obstetrics & Gynecology

## 2020-11-05 ENCOUNTER — Encounter (HOSPITAL_COMMUNITY): Payer: Self-pay | Admitting: Obstetrics and Gynecology

## 2020-11-05 ENCOUNTER — Other Ambulatory Visit (HOSPITAL_COMMUNITY): Payer: BC Managed Care – PPO

## 2020-11-05 DIAGNOSIS — Z3A39 39 weeks gestation of pregnancy: Secondary | ICD-10-CM | POA: Diagnosis not present

## 2020-11-05 DIAGNOSIS — O34211 Maternal care for low transverse scar from previous cesarean delivery: Principal | ICD-10-CM | POA: Diagnosis present

## 2020-11-05 DIAGNOSIS — O99214 Obesity complicating childbirth: Secondary | ICD-10-CM | POA: Diagnosis present

## 2020-11-05 DIAGNOSIS — F419 Anxiety disorder, unspecified: Secondary | ICD-10-CM | POA: Diagnosis present

## 2020-11-05 DIAGNOSIS — O99344 Other mental disorders complicating childbirth: Secondary | ICD-10-CM | POA: Diagnosis present

## 2020-11-05 DIAGNOSIS — Z20822 Contact with and (suspected) exposure to covid-19: Secondary | ICD-10-CM | POA: Diagnosis present

## 2020-11-05 DIAGNOSIS — Z98891 History of uterine scar from previous surgery: Secondary | ICD-10-CM

## 2020-11-05 DIAGNOSIS — O9081 Anemia of the puerperium: Secondary | ICD-10-CM | POA: Diagnosis not present

## 2020-11-05 DIAGNOSIS — F32A Depression, unspecified: Secondary | ICD-10-CM | POA: Diagnosis present

## 2020-11-05 LAB — RESP PANEL BY RT-PCR (FLU A&B, COVID) ARPGX2
Influenza A by PCR: NEGATIVE
Influenza B by PCR: NEGATIVE
SARS Coronavirus 2 by RT PCR: NEGATIVE

## 2020-11-05 LAB — CBC
HCT: 35.8 % — ABNORMAL LOW (ref 36.0–46.0)
Hemoglobin: 11.3 g/dL — ABNORMAL LOW (ref 12.0–15.0)
MCH: 28.1 pg (ref 26.0–34.0)
MCHC: 31.6 g/dL (ref 30.0–36.0)
MCV: 89.1 fL (ref 80.0–100.0)
Platelets: 338 K/uL (ref 150–400)
RBC: 4.02 MIL/uL (ref 3.87–5.11)
RDW: 13.8 % (ref 11.5–15.5)
WBC: 14.2 K/uL — ABNORMAL HIGH (ref 4.0–10.5)
nRBC: 0 % (ref 0.0–0.2)

## 2020-11-05 LAB — TYPE AND SCREEN
ABO/RH(D): O POS
Antibody Screen: NEGATIVE

## 2020-11-05 LAB — POCT FERN TEST: POCT Fern Test: POSITIVE

## 2020-11-05 SURGERY — Surgical Case
Anesthesia: Spinal

## 2020-11-05 MED ORDER — SENNOSIDES-DOCUSATE SODIUM 8.6-50 MG PO TABS
2.0000 | ORAL_TABLET | Freq: Every day | ORAL | Status: DC
  Administered 2020-11-06 – 2020-11-07 (×2): 2 via ORAL
  Filled 2020-11-05 (×2): qty 2

## 2020-11-05 MED ORDER — OXYTOCIN-SODIUM CHLORIDE 30-0.9 UT/500ML-% IV SOLN
INTRAVENOUS | Status: AC
  Filled 2020-11-05: qty 500

## 2020-11-05 MED ORDER — PHENYLEPHRINE HCL-NACL 20-0.9 MG/250ML-% IV SOLN
INTRAVENOUS | Status: DC | PRN
  Administered 2020-11-05: 60 ug/min via INTRAVENOUS

## 2020-11-05 MED ORDER — OXYCODONE HCL 5 MG PO TABS: 5.0000 mg | ORAL_TABLET | ORAL | Status: DC | PRN

## 2020-11-05 MED ORDER — DIPHENHYDRAMINE HCL 50 MG/ML IJ SOLN: 12.5000 mg | INTRAMUSCULAR | Status: DC | PRN

## 2020-11-05 MED ORDER — ACETAMINOPHEN 10 MG/ML IV SOLN
INTRAVENOUS | Status: DC | PRN
  Administered 2020-11-05: 1000 mg via INTRAVENOUS

## 2020-11-05 MED ORDER — IBUPROFEN 600 MG PO TABS
600.0000 mg | ORAL_TABLET | Freq: Four times a day (QID) | ORAL | Status: DC
  Administered 2020-11-06 – 2020-11-07 (×4): 600 mg via ORAL
  Filled 2020-11-05 (×4): qty 1

## 2020-11-05 MED ORDER — KETOROLAC TROMETHAMINE 30 MG/ML IJ SOLN
30.0000 mg | Freq: Four times a day (QID) | INTRAMUSCULAR | Status: AC
  Administered 2020-11-05 – 2020-11-06 (×3): 30 mg via INTRAVENOUS
  Filled 2020-11-05 (×3): qty 1

## 2020-11-05 MED ORDER — GENTAMICIN SULFATE 40 MG/ML IJ SOLN
5.0000 mg/kg | INTRAVENOUS | Status: AC
  Administered 2020-11-05: 380 mg via INTRAVENOUS
  Filled 2020-11-05: qty 9.5

## 2020-11-05 MED ORDER — KETOROLAC TROMETHAMINE 30 MG/ML IJ SOLN
INTRAMUSCULAR | Status: AC
  Filled 2020-11-05: qty 1

## 2020-11-05 MED ORDER — NALBUPHINE HCL 10 MG/ML IJ SOLN: 5.0000 mg | Freq: Once | INTRAMUSCULAR | Status: DC | PRN

## 2020-11-05 MED ORDER — BUPIVACAINE IN DEXTROSE 0.75-8.25 % IT SOLN
INTRATHECAL | Status: DC | PRN
  Administered 2020-11-05: 1.6 mL via INTRATHECAL

## 2020-11-05 MED ORDER — NALOXONE HCL 4 MG/10ML IJ SOLN
1.0000 ug/kg/h | INTRAVENOUS | Status: DC | PRN
  Filled 2020-11-05: qty 5

## 2020-11-05 MED ORDER — SODIUM CHLORIDE 0.9 % IR SOLN
Status: DC | PRN
  Administered 2020-11-05: 1000 mL

## 2020-11-05 MED ORDER — SODIUM CHLORIDE 0.9% FLUSH: 3.0000 mL | INTRAVENOUS | Status: DC | PRN

## 2020-11-05 MED ORDER — NALBUPHINE HCL 10 MG/ML IJ SOLN: 5.0000 mg | INTRAMUSCULAR | Status: DC | PRN

## 2020-11-05 MED ORDER — SCOPOLAMINE 1 MG/3DAYS TD PT72
MEDICATED_PATCH | TRANSDERMAL | Status: AC
  Filled 2020-11-05: qty 1

## 2020-11-05 MED ORDER — ONDANSETRON HCL 4 MG/2ML IJ SOLN
INTRAMUSCULAR | Status: DC | PRN
  Administered 2020-11-05: 4 mg via INTRAVENOUS

## 2020-11-05 MED ORDER — PHENYLEPHRINE HCL (PRESSORS) 10 MG/ML IV SOLN
INTRAVENOUS | Status: DC | PRN
  Administered 2020-11-05: 200 ug via INTRAVENOUS

## 2020-11-05 MED ORDER — DEXAMETHASONE SODIUM PHOSPHATE 4 MG/ML IJ SOLN
INTRAMUSCULAR | Status: AC
  Filled 2020-11-05: qty 2

## 2020-11-05 MED ORDER — ONDANSETRON HCL 4 MG/2ML IJ SOLN
INTRAMUSCULAR | Status: AC
  Filled 2020-11-05: qty 2

## 2020-11-05 MED ORDER — OXYCODONE HCL 5 MG/5ML PO SOLN: 5.0000 mg | Freq: Once | ORAL | Status: DC | PRN

## 2020-11-05 MED ORDER — OXYCODONE HCL 5 MG PO TABS
5.0000 mg | ORAL_TABLET | Freq: Once | ORAL | Status: DC | PRN
Start: 2020-11-05 — End: 2020-11-05

## 2020-11-05 MED ORDER — SCOPOLAMINE 1 MG/3DAYS TD PT72
1.0000 | MEDICATED_PATCH | Freq: Once | TRANSDERMAL | Status: DC
  Administered 2020-11-05: 1.5 mg via TRANSDERMAL

## 2020-11-05 MED ORDER — DIPHENHYDRAMINE HCL 25 MG PO CAPS: 25.0000 mg | ORAL_CAPSULE | ORAL | Status: DC | PRN

## 2020-11-05 MED ORDER — DOXYLAMINE SUCCINATE (SLEEP) 25 MG PO TABS
25.0000 mg | ORAL_TABLET | Freq: Every evening | ORAL | Status: DC | PRN
  Filled 2020-11-05: qty 1

## 2020-11-05 MED ORDER — FENTANYL CITRATE (PF) 100 MCG/2ML IJ SOLN
INTRAMUSCULAR | Status: AC
  Filled 2020-11-05: qty 2

## 2020-11-05 MED ORDER — NALOXONE HCL 0.4 MG/ML IJ SOLN: 0.4000 mg | INTRAMUSCULAR | Status: DC | PRN

## 2020-11-05 MED ORDER — OXYTOCIN-SODIUM CHLORIDE 30-0.9 UT/500ML-% IV SOLN
INTRAVENOUS | Status: DC | PRN
  Administered 2020-11-05: 200 mL via INTRAVENOUS

## 2020-11-05 MED ORDER — WITCH HAZEL-GLYCERIN EX PADS: 1.0000 "application " | MEDICATED_PAD | CUTANEOUS | Status: DC | PRN

## 2020-11-05 MED ORDER — MEPERIDINE HCL 25 MG/ML IJ SOLN: 6.2500 mg | INTRAMUSCULAR | Status: DC | PRN

## 2020-11-05 MED ORDER — FENTANYL CITRATE (PF) 100 MCG/2ML IJ SOLN
INTRAMUSCULAR | Status: DC | PRN
  Administered 2020-11-05: 15 ug via INTRATHECAL

## 2020-11-05 MED ORDER — PHENYLEPHRINE 40 MCG/ML (10ML) SYRINGE FOR IV PUSH (FOR BLOOD PRESSURE SUPPORT)
PREFILLED_SYRINGE | INTRAVENOUS | Status: AC
  Filled 2020-11-05: qty 10

## 2020-11-05 MED ORDER — FENTANYL CITRATE (PF) 100 MCG/2ML IJ SOLN: 25.0000 ug | INTRAMUSCULAR | Status: DC | PRN

## 2020-11-05 MED ORDER — CLINDAMYCIN PHOSPHATE 900 MG/50ML IV SOLN
900.0000 mg | INTRAVENOUS | Status: AC
  Administered 2020-11-05: 900 mg via INTRAVENOUS

## 2020-11-05 MED ORDER — SOD CITRATE-CITRIC ACID 500-334 MG/5ML PO SOLN
30.0000 mL | Freq: Once | ORAL | Status: AC
Start: 2020-11-05 — End: 2020-11-05
  Administered 2020-11-05: 30 mL via ORAL
  Filled 2020-11-05: qty 30

## 2020-11-05 MED ORDER — METOCLOPRAMIDE HCL 5 MG/ML IJ SOLN
INTRAMUSCULAR | Status: AC
  Filled 2020-11-05: qty 2

## 2020-11-05 MED ORDER — ZOLPIDEM TARTRATE 5 MG PO TABS: 5.0000 mg | ORAL_TABLET | Freq: Every evening | ORAL | Status: DC | PRN

## 2020-11-05 MED ORDER — LACTATED RINGERS IV SOLN: INTRAVENOUS | Status: DC

## 2020-11-05 MED ORDER — PHENYLEPHRINE HCL-NACL 20-0.9 MG/250ML-% IV SOLN
INTRAVENOUS | Status: AC
  Filled 2020-11-05: qty 250

## 2020-11-05 MED ORDER — ONDANSETRON HCL 4 MG/2ML IJ SOLN: 4.0000 mg | Freq: Three times a day (TID) | INTRAMUSCULAR | Status: DC | PRN

## 2020-11-05 MED ORDER — ACETAMINOPHEN 500 MG PO TABS
1000.0000 mg | ORAL_TABLET | Freq: Four times a day (QID) | ORAL | Status: DC
  Administered 2020-11-05 – 2020-11-07 (×8): 1000 mg via ORAL
  Filled 2020-11-05 (×8): qty 2

## 2020-11-05 MED ORDER — FAMOTIDINE IN NACL 20-0.9 MG/50ML-% IV SOLN
20.0000 mg | Freq: Once | INTRAVENOUS | Status: AC
  Administered 2020-11-05: 20 mg via INTRAVENOUS
  Filled 2020-11-05: qty 50

## 2020-11-05 MED ORDER — FAMOTIDINE 20 MG PO TABS
40.0000 mg | ORAL_TABLET | Freq: Every day | ORAL | Status: DC
  Administered 2020-11-05 – 2020-11-06 (×2): 40 mg via ORAL
  Filled 2020-11-05 (×2): qty 2

## 2020-11-05 MED ORDER — HYDROMORPHONE HCL 1 MG/ML IJ SOLN: 0.2000 mg | INTRAMUSCULAR | Status: DC | PRN

## 2020-11-05 MED ORDER — DIBUCAINE (PERIANAL) 1 % EX OINT: 1.0000 "application " | TOPICAL_OINTMENT | CUTANEOUS | Status: DC | PRN

## 2020-11-05 MED ORDER — KETOROLAC TROMETHAMINE 30 MG/ML IJ SOLN
30.0000 mg | Freq: Four times a day (QID) | INTRAMUSCULAR | Status: AC | PRN
  Administered 2020-11-05: 30 mg via INTRAVENOUS

## 2020-11-05 MED ORDER — KETOROLAC TROMETHAMINE 30 MG/ML IJ SOLN: 30.0000 mg | Freq: Four times a day (QID) | INTRAMUSCULAR | Status: AC | PRN

## 2020-11-05 MED ORDER — MORPHINE SULFATE (PF) 0.5 MG/ML IJ SOLN
INTRAMUSCULAR | Status: DC | PRN
  Administered 2020-11-05: 150 ug via INTRATHECAL

## 2020-11-05 MED ORDER — COCONUT OIL OIL: 1.0000 "application " | TOPICAL_OIL | Status: DC | PRN

## 2020-11-05 MED ORDER — DEXAMETHASONE SODIUM PHOSPHATE 4 MG/ML IJ SOLN
INTRAMUSCULAR | Status: DC | PRN
  Administered 2020-11-05: 8 mg via INTRAVENOUS

## 2020-11-05 MED ORDER — MENTHOL 3 MG MT LOZG: 1.0000 | LOZENGE | OROMUCOSAL | Status: DC | PRN

## 2020-11-05 MED ORDER — SIMETHICONE 80 MG PO CHEW
80.0000 mg | CHEWABLE_TABLET | Freq: Three times a day (TID) | ORAL | Status: DC
  Administered 2020-11-05 – 2020-11-07 (×5): 80 mg via ORAL
  Filled 2020-11-05 (×5): qty 1

## 2020-11-05 MED ORDER — TETANUS-DIPHTH-ACELL PERTUSSIS 5-2.5-18.5 LF-MCG/0.5 IM SUSY: 0.5000 mL | PREFILLED_SYRINGE | Freq: Once | INTRAMUSCULAR | Status: DC

## 2020-11-05 MED ORDER — METOCLOPRAMIDE HCL 5 MG/ML IJ SOLN
INTRAMUSCULAR | Status: DC | PRN
  Administered 2020-11-05: 10 mg via INTRAVENOUS

## 2020-11-05 MED ORDER — LACTATED RINGERS IV SOLN: INTRAVENOUS | Status: DC | PRN

## 2020-11-05 MED ORDER — OXYTOCIN-SODIUM CHLORIDE 30-0.9 UT/500ML-% IV SOLN: 2.5000 [IU]/h | INTRAVENOUS | Status: AC

## 2020-11-05 MED ORDER — LACTATED RINGERS IV BOLUS
1000.0000 mL | Freq: Once | INTRAVENOUS | Status: AC
  Administered 2020-11-05: 1000 mL via INTRAVENOUS

## 2020-11-05 MED ORDER — SERTRALINE HCL 50 MG PO TABS
50.0000 mg | ORAL_TABLET | Freq: Every day | ORAL | Status: DC
  Administered 2020-11-05 – 2020-11-06 (×2): 50 mg via ORAL
  Filled 2020-11-05 (×2): qty 1

## 2020-11-05 MED ORDER — MORPHINE SULFATE (PF) 0.5 MG/ML IJ SOLN
INTRAMUSCULAR | Status: AC
  Filled 2020-11-05: qty 10

## 2020-11-05 MED ORDER — PRENATAL MULTIVITAMIN CH
1.0000 | ORAL_TABLET | Freq: Every day | ORAL | Status: DC
  Administered 2020-11-06 – 2020-11-07 (×2): 1 via ORAL
  Filled 2020-11-05 (×2): qty 1

## 2020-11-05 MED ORDER — PROMETHAZINE HCL 25 MG/ML IJ SOLN: 6.2500 mg | INTRAMUSCULAR | Status: DC | PRN

## 2020-11-05 MED ORDER — SIMETHICONE 80 MG PO CHEW: 80.0000 mg | CHEWABLE_TABLET | ORAL | Status: DC | PRN

## 2020-11-05 MED ORDER — BUTORPHANOL TARTRATE 1 MG/ML IJ SOLN
1.0000 mg | INTRAMUSCULAR | Status: DC | PRN
  Administered 2020-11-05: 1 mg via INTRAVENOUS
  Filled 2020-11-05: qty 1

## 2020-11-05 SURGICAL SUPPLY — 35 items
BENZOIN TINCTURE PRP APPL 2/3 (GAUZE/BANDAGES/DRESSINGS) ×2 IMPLANT
CHLORAPREP W/TINT 26ML (MISCELLANEOUS) ×2 IMPLANT
CLAMP CORD UMBIL (MISCELLANEOUS) IMPLANT
CLOTH BEACON ORANGE TIMEOUT ST (SAFETY) ×2 IMPLANT
DERMABOND ADVANCED (GAUZE/BANDAGES/DRESSINGS)
DERMABOND ADVANCED .7 DNX12 (GAUZE/BANDAGES/DRESSINGS) IMPLANT
DRSG OPSITE POSTOP 4X10 (GAUZE/BANDAGES/DRESSINGS) ×2 IMPLANT
ELECT REM PT RETURN 9FT ADLT (ELECTROSURGICAL) ×2
ELECTRODE REM PT RTRN 9FT ADLT (ELECTROSURGICAL) ×1 IMPLANT
EXTRACTOR VACUUM KIWI (MISCELLANEOUS) IMPLANT
GLOVE BIO SURGEON STRL SZ 6 (GLOVE) ×2 IMPLANT
GLOVE BIOGEL PI IND STRL 6 (GLOVE) ×2 IMPLANT
GLOVE BIOGEL PI IND STRL 7.0 (GLOVE) ×1 IMPLANT
GLOVE BIOGEL PI INDICATOR 6 (GLOVE) ×2
GLOVE BIOGEL PI INDICATOR 7.0 (GLOVE) ×1
GOWN STRL REUS W/TWL LRG LVL3 (GOWN DISPOSABLE) ×4 IMPLANT
HEMOSTAT ARISTA ABSORB 3G PWDR (HEMOSTASIS) ×2 IMPLANT
KIT ABG SYR 3ML LUER SLIP (SYRINGE) ×2 IMPLANT
NEEDLE HYPO 25X5/8 SAFETYGLIDE (NEEDLE) ×2 IMPLANT
NS IRRIG 1000ML POUR BTL (IV SOLUTION) ×2 IMPLANT
PACK C SECTION WH (CUSTOM PROCEDURE TRAY) ×2 IMPLANT
PAD OB MATERNITY 4.3X12.25 (PERSONAL CARE ITEMS) ×2 IMPLANT
PENCIL SMOKE EVAC W/HOLSTER (ELECTROSURGICAL) ×2 IMPLANT
STRIP CLOSURE SKIN 1/2X4 (GAUZE/BANDAGES/DRESSINGS) ×2 IMPLANT
SUT CHROMIC 0 CTX 36 (SUTURE) ×6 IMPLANT
SUT MON AB 2-0 CT1 27 (SUTURE) ×2 IMPLANT
SUT PDS AB 0 CT1 27 (SUTURE) IMPLANT
SUT PLAIN 0 NONE (SUTURE) IMPLANT
SUT VIC AB 0 CT1 36 (SUTURE) IMPLANT
SUT VIC AB 2-0 CT1 27 (SUTURE) ×2
SUT VIC AB 2-0 CT1 TAPERPNT 27 (SUTURE) ×2 IMPLANT
SUT VIC AB 4-0 KS 27 (SUTURE) IMPLANT
TOWEL OR 17X24 6PK STRL BLUE (TOWEL DISPOSABLE) ×2 IMPLANT
TRAY FOLEY W/BAG SLVR 14FR LF (SET/KITS/TRAYS/PACK) IMPLANT
WATER STERILE IRR 1000ML POUR (IV SOLUTION) ×2 IMPLANT

## 2020-11-05 NOTE — Anesthesia Procedure Notes (Signed)
Spinal  Patient location during procedure: OR Start time: 11/05/2020 11:41 AM End time: 11/05/2020 11:44 AM Reason for block: surgical anesthesia Staffing Performed: anesthesiologist  Anesthesiologist: Audry Pili, MD Preanesthetic Checklist Completed: patient identified, IV checked, risks and benefits discussed, surgical consent, monitors and equipment checked, pre-op evaluation and timeout performed Spinal Block Patient position: sitting Prep: DuraPrep Patient monitoring: heart rate, cardiac monitor, continuous pulse ox and blood pressure Approach: midline Location: L3-4 Injection technique: single-shot Needle Needle type: Pencan  Needle gauge: 24 G Additional Notes Consent was obtained prior to the procedure with all questions answered and concerns addressed. Risks including, but not limited to, bleeding, infection, nerve damage, paralysis, failed block, inadequate analgesia, allergic reaction, high spinal, itching, and headache were discussed and the patient wished to proceed. Functioning IV was confirmed and monitors were applied. Sterile prep and drape, including hand hygiene, mask, and sterile gloves were used. The patient was positioned and the spine was prepped. The skin was anesthetized with lidocaine. Free flow of clear CSF was obtained prior to injecting local anesthetic into the CSF. The spinal needle aspirated freely following injection. The needle was carefully withdrawn. The patient tolerated the procedure well.   Renold Don, MD

## 2020-11-05 NOTE — Op Note (Signed)
PROCEDURE DATE: 11/05/20   PREOPERATIVE DIAGNOSIS:  SROM Labor Repeat cesarean section   POSTOPERATIVE DIAGNOSIS:  The same and bladder tacked high on uterus   PROCEDURE:    Repeat Low Transverse Cesarean Section   SURGEON:  Dr. Lucillie Garfinkel   INDICATIONS: This is a 40yo G3P1011 at 73.6 wga requiring cesarean section secondary to desires repeat cesarean section.  She presented in early labor and s/p ROM. She declined a VTOLAC. Decision made to proceed with LTCS. The risks of cesarean section discussed with the patient included but were not limited to: bleeding which may require transfusion or reoperation; infection which may require antibiotics; injury to bowel, bladder, ureters or other surrounding organs; injury to the fetus; need for additional procedures including hysterectomy in the event of a life-threatening hemorrhage; placental abnormalities wth subsequent pregnancies, incisional problems, thromboembolic phenomenon and other postoperative/anesthesia complications. The patient agreed with the proposed plan, giving informed consent for the procedure.     FINDINGS:  Viable female infant in vertex presentation, APGARspending,  Weight pending, Amniotic fluid clear,  Intact placenta, three vessel cord.  Grossly normal uterus except bladder was tacked high on LUS as well as anteriorly .   ANESTHESIA:    Epidural ESTIMATED BLOOD LOSS: final pending, <500cc SPECIMENS: Placenta for routine COMPLICATIONS: None immediate   PROCEDURE IN DETAIL:  The patient received intravenous antibiotics (gent and clinda) and had sequential compression devices applied to her lower extremities while in the preoperative area.  She was then taken to the operating room where epidural anesthesia was dosed up to surgical level and was found to be adequate. She was then placed in a dorsal supine position with a leftward tilt, and prepped and draped in a sterile manner.  A foley catheter was placed into her bladder and  attached to constant gravity.  After an adequate timeout was performed, a Pfannenstiel skin incision was made with scalpel and carried through to the underlying layer of fascia. The fascia was incised in the midline and this incision was extended bilaterally using the Mayo scissors. Kocher clamps were applied to the superior aspect of the fascial incision and the underlying rectus muscles were dissected off bluntly. A similar process was carried out on the inferior aspect of the facial incision. The rectus muscles were separated in the midline bluntly and the peritoneum was entered sharply. The bladder was tacked abnormally high anteriorly and on the uterus. A bladder flap was created sharply and developed bluntly. In order to have appropriate visualization and enough room to extract the baby, the rectus muscles had to be incised ~1cm bilaterally. A transverse hysterotomy was made with a scalpel and extended bilaterally bluntly. The bladder blade was then removed. The infant was successfully delivered, and cord was clamped and cut and infant was handed over to awaiting neonatology team. Uterine massage was then administered and the placenta delivered intact with three-vessel cord. Cord gases were taken. The uterus was cleared of clot and debris.  The hysterotomy was closed with 0 vicryl.  A second imbricating suture of 0-vicryl was used to reinforce the incision and aid in hemostasis.The rectus muscles were reapproximated bilaterally with four interrupted stitches. The fascia was closed with 0-Vicryl in a running fashion with good restoration of anatomy.  The subcutaneus tissue was irrigated and was reapproximated using three interrupted plain gut stitches.  The skin was closed with 4-0 Vicryl in a subcuticular fashion.  All surgical site and was hemostatic at end of procedure) without any further bleeding on  exam.   It's a boy - "Lewayne Bunting"!!    Pt tolerated the procedure well. All sponge/lap/needle  counts were correct  X 2. Pt taken to recovery room in stable condition.     Lucillie Garfinkel MD

## 2020-11-05 NOTE — Anesthesia Preprocedure Evaluation (Addendum)
Anesthesia Evaluation  Patient identified by MRN, date of birth, ID band Patient awake    Reviewed: Allergy & Precautions, NPO status , Patient's Chart, lab work & pertinent test results  History of Anesthesia Complications Negative for: history of anesthetic complications  Airway Mallampati: II  TM Distance: >3 FB Neck ROM: Full    Dental   Pulmonary neg pulmonary ROS,    Pulmonary exam normal        Cardiovascular hypertension (Gestational - previous pregnancy), Normal cardiovascular exam     Neuro/Psych PSYCHIATRIC DISORDERS Anxiety Depression negative neurological ROS     GI/Hepatic negative GI ROS, Neg liver ROS,   Endo/Other   Obesity   Renal/GU negative Renal ROS     Musculoskeletal negative musculoskeletal ROS (+)   Abdominal   Peds  Hematology negative hematology ROS (+)  Plt 338k    Anesthesia Other Findings   Reproductive/Obstetrics (+) Pregnancy                           Anesthesia Physical Anesthesia Plan  ASA: 2  Anesthesia Plan: Spinal   Post-op Pain Management:    Induction:   PONV Risk Score and Plan: 2 and Treatment may vary due to age or medical condition, Ondansetron and Scopolamine patch - Pre-op  Airway Management Planned: Natural Airway  Additional Equipment: None  Intra-op Plan:   Post-operative Plan:   Informed Consent: I have reviewed the patients History and Physical, chart, labs and discussed the procedure including the risks, benefits and alternatives for the proposed anesthesia with the patient or authorized representative who has indicated his/her understanding and acceptance.       Plan Discussed with: CRNA and Anesthesiologist  Anesthesia Plan Comments: (Labs reviewed, platelets acceptable. Discussed risks and benefits of spinal, including spinal/epidural hematoma, infection, failed block, and PDPH. Patient expressed understanding and  wished to proceed. )       Anesthesia Quick Evaluation

## 2020-11-05 NOTE — MAU Note (Signed)
Samon Dishner is a 40 y.o. at [redacted]w[redacted]d here in MAU reporting: LOF since 0500, states it is clear and yellow. Contractions started after LOF and they are about every 5 min. Is for RCS.  Onset of complaint: today  Pain score: 7/10  Vitals:   11/05/20 0820  BP: 128/78  Pulse: (!) 112  Resp: 18  Temp: 98.8 F (37.1 C)  SpO2: 98%     FHT: EFM applied  Lab orders placed from triage: none

## 2020-11-05 NOTE — Lactation Note (Signed)
This note was copied from a baby's chart. Lactation Consultation Note  Patient Name: Laurie Hicks DVVOH'Y Date: 11/05/2020 Reason for consult: Initial assessment;Mother's request;Difficult latch;Early term 37-38.6wks (DAT tve) Age: 40 yrs   Infant showing signs of hunger on arrival. Mom stated latches short infant popping on and off the breasts.  LC did some suck training, infant able to latch to finger with CAH sound with breast compression and pulling down the chin to flange bottom lip.   Mom denied any pain with the latch. LC observed latch for 9 min and infant still feeding at the end of the visit.   Mom's nipples are erect with no pain noted with latching.  Plan 1. To feed based on cues 8-12x in 24 hr period no more than 4 hrs without an attempt. Mom to offer both breasts and look for CAH sound.   2. Mom can offer EBM via spoon and finger feeding if unable to get infant to latch.  3. I and O sheet reviewed.  [place] brochure of inpatient and outpatient services reviewed.  All questions answered at the end of the visit.  Maternal Data Has patient been taught Hand Expression?: Yes Does the patient have breastfeeding experience prior to this delivery?: Yes How long did the patient breastfeed?: First child had a lip/tongue tie correction at 2 months and mother able to breastfeed for 14 months  Feeding Mother's Current Feeding Choice: Breast Milk  LATCH Score Latch: Repeated attempts needed to sustain latch, nipple held in mouth throughout feeding, stimulation needed to elicit sucking reflex.  Audible Swallowing: Spontaneous and intermittent  Type of Nipple: Everted at rest and after stimulation  Comfort (Breast/Nipple): Soft / non-tender  Hold (Positioning): Assistance needed to correctly position infant at breast and maintain latch.  LATCH Score: 8   Lactation Tools Discussed/Used    Interventions Interventions: Breast feeding basics reviewed;Breast  compression;Assisted with latch;Adjust position;Skin to skin;Support pillows;DEBP;Breast massage;Position options;Hand express;Expressed milk;Education  Discharge    Consult Status Consult Status: Follow-up Date: 11/06/20 Follow-up type: In-patient    Rissa Turley  Nicholson-Springer 11/05/2020, 4:43 PM

## 2020-11-05 NOTE — Transfer of Care (Signed)
Immediate Anesthesia Transfer of Care Note  Patient: Laurie Hicks  Procedure(s) Performed: REPEAT CESAREAN SECTION EDC: 11-13-20 ALLERG: PENICILLINS  Patient Location: PACU  Anesthesia Type:Spinal  Level of Consciousness: awake, alert  and oriented  Airway & Oxygen Therapy: Patient Spontanous Breathing  Post-op Assessment: Report given to RN and Post -op Vital signs reviewed and stable  Post vital signs: Reviewed and stable  Last Vitals:  Vitals Value Taken Time  BP    Temp    Pulse    Resp    SpO2      Last Pain:  Vitals:   11/05/20 1058  TempSrc: Oral  PainSc:          Complications: No notable events documented.

## 2020-11-05 NOTE — Anesthesia Postprocedure Evaluation (Signed)
Anesthesia Post Note  Patient: Laurie Hicks  Procedure(s) Performed: REPEAT CESAREAN SECTION EDC: 11-13-20 ALLERG: PENICILLINS     Patient location during evaluation: PACU Anesthesia Type: Spinal Level of consciousness: awake and alert Pain management: pain level controlled Vital Signs Assessment: post-procedure vital signs reviewed and stable Respiratory status: spontaneous breathing and respiratory function stable Cardiovascular status: blood pressure returned to baseline and stable Postop Assessment: spinal receding and no apparent nausea or vomiting Anesthetic complications: no   No notable events documented.  Last Vitals:  Vitals:   11/05/20 1400 11/05/20 1406  BP: (!) 108/50 (!) 112/56  Pulse: 88 81  Resp: 17 18  Temp: 37.1 C 37.1 C  SpO2: 100% 98%    Last Pain:  Vitals:   11/05/20 1415  TempSrc:   PainSc: 0-No pain   Pain Goal:                Epidural/Spinal Function Cutaneous sensation: Tingles (11/05/20 1415), Patient able to flex knees: Yes (11/05/20 1415), Patient able to lift hips off bed: No (11/05/20 1415), Back pain beyond tenderness at insertion site: No (11/05/20 1415), Progressively worsening motor and/or sensory loss: No (11/05/20 1415), Bowel and/or bladder incontinence post epidural: No (11/05/20 1415)  Audry Pili

## 2020-11-05 NOTE — H&P (Signed)
Kirandeep Fariss is a 40 y.o. female G3P1011 at 110 weeks presenting for repeat C/S s/p PROM and in early labor.  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  Patient is AMA with low risk NIPS.  Patient has h/o Pre-E and has taken low dose ASA this pregnancy which was stopped at 37 wga.  GBS negative. She is expecting another boy.   OB History     Gravida  3   Para  1   Term  1   Preterm      AB  1   Living  1      SAB  1   IAB      Ectopic      Multiple  0   Live Births  1          Past Medical History:  Diagnosis Date   Anxiety    Depression    Past Surgical History:  Procedure Laterality Date   CESAREAN SECTION N/A 03/13/2019   Procedure: CESAREAN SECTION;  Surgeon: Linda Hedges, DO;  Location: [facility] LD ORS;  Service: Obstetrics;  Laterality: N/A;   FOOT SURGERY     Family History: family history includes Heart disease in her maternal grandfather and paternal grandfather; Hypertension in her mother. Social History:  reports that she has never smoked. She has never used smokeless tobacco. She reports previous alcohol use. She reports that she does not use drugs.     Maternal Diabetes: No Genetic Screening: Normal Maternal Ultrasounds/Referrals: Normal Fetal Ultrasounds or other Referrals:  None Maternal Substance Abuse:  No Significant Maternal Medications:  Meds include: Zoloft Significant Maternal Lab Results:  Group B Strep negative Other Comments:  None  Review of Systems Maternal Medical History:  Prenatal complications: no prenatal complications Prenatal Complications - Diabetes: none.  Dilation: 2.5 Effacement (%): 70 Station: -3, -2 Exam by:: n druebbisch rn Blood pressure 128/78, pulse (!) 112, temperature 98.8 F (37.1 C), temperature source Oral, resp. rate 18, SpO2 98 %, unknown if currently breastfeeding. Maternal Exam:  Abdomen: Patient reports no abdominal tenderness. Surgical scars: low  transverse.   Fundal height is c/w dates.   Estimated fetal weight is 8#.    Physical Exam Constitutional:      Appearance: Normal appearance.  HENT:     Head: Normocephalic and atraumatic.  Pulmonary:     Effort: Pulmonary effort is normal.  Abdominal:     Palpations: Abdomen is soft.  Musculoskeletal:        General: Normal range of motion.     Cervical back: Normal range of motion.  Skin:    General: Skin is warm and dry.  Neurological:     Mental Status: She is alert and oriented to person, place, and time.  Psychiatric:        Mood and Affect: Mood normal.        Behavior: Behavior normal.    Prenatal labs: ABO, Rh: O/Positive/-- (12/03 0000) Antibody: Negative (12/03 0000) Rubella: Immune (12/03 0000) RPR: Nonreactive (12/03 0000)  HBsAg: Negative (12/03 0000)  HIV: Non-reactive (12/03 0000)  GBS:   Negative  Assessment/Plan: 40yo G3P1011 at 38.6 wga presenting in early labor and s/p SROM. She was offered a VTOLAC and declines. She requests RCS.  Patient has been counseled re: risk of bleeding, infection, scarring, and damage to surrounding structures.  She has been counseled re: risk of uterine rupture and abnormal placentation.  All questions were answered and the patient wishes  to proceed. 2g ancef on call to the OR.  Colin Benton Rosevelt Luu 11/05/2020, 9:08 AM

## 2020-11-06 ENCOUNTER — Encounter (HOSPITAL_COMMUNITY): Payer: Self-pay | Admitting: Obstetrics and Gynecology

## 2020-11-06 LAB — CBC
HCT: 28.2 % — ABNORMAL LOW (ref 36.0–46.0)
Hemoglobin: 8.9 g/dL — ABNORMAL LOW (ref 12.0–15.0)
MCH: 28.4 pg (ref 26.0–34.0)
MCHC: 31.6 g/dL (ref 30.0–36.0)
MCV: 90.1 fL (ref 80.0–100.0)
Platelets: 248 10*3/uL (ref 150–400)
RBC: 3.13 MIL/uL — ABNORMAL LOW (ref 3.87–5.11)
RDW: 14.2 % (ref 11.5–15.5)
WBC: 13.8 10*3/uL — ABNORMAL HIGH (ref 4.0–10.5)
nRBC: 0 % (ref 0.0–0.2)

## 2020-11-06 LAB — RPR: RPR Ser Ql: NONREACTIVE

## 2020-11-06 MED ORDER — FERROUS SULFATE 325 (65 FE) MG PO TABS
325.0000 mg | ORAL_TABLET | ORAL | Status: DC
  Administered 2020-11-06: 325 mg via ORAL
  Filled 2020-11-06: qty 1

## 2020-11-06 MED ORDER — DOCUSATE SODIUM 100 MG PO CAPS: 100.0000 mg | ORAL_CAPSULE | Freq: Every day | ORAL | Status: DC | PRN

## 2020-11-06 NOTE — Progress Notes (Signed)
Pt up and ambulated to bathroom with slow and steady gait.  Denies pain.  Peri care given with instruction.  Verbalizes understanding.  Back to bed without incident.

## 2020-11-06 NOTE — Progress Notes (Signed)
Postpartum Progress Note  Postpartum Day 1 s/p repeat Cesarean section.  Subjective:  Patient reports no overnight events.  She reports well controlled pain, ambulating without difficulty, voiding spontaneously, tolerating PO.  She reports Negative flatus, Negative BM.  Vaginal bleeding is minimal.  Objective: Blood pressure 101/66, pulse 73, temperature 98.1 F (36.7 C), resp. rate 15, SpO2 98 %, unknown if currently breastfeeding.  Physical Exam:  General: alert and no distress Lochia: appropriate Uterine Fundus: firm Incision: no shadowing present DVT Evaluation: No evidence of DVT seen on physical exam.  Recent Labs    11/05/20 0845 11/06/20 0517  HGB 11.3* 8.9*  HCT 35.8* 28.2*    Assessment/Plan: Postpartum Day 1, s/p C-section Postoperative anemia - Fe/colace Lactation following Circ when able. Doing well, continue routine postpartum care. Anticipate discharge PPD#2 or 3.   LOS: 1 day   Carlyon Shadow 11/06/2020, 7:42 AM

## 2020-11-06 NOTE — Progress Notes (Signed)
CSW received consult for hx of PPD ([name] is currently on Zoloft).  CSW met with [name] to offer support and complete assessment.    When CSW arrived, [name] was bonding with infant as evidence by engaging in breastfeeding (Mom and baby appeared comfortable and happy). CSW offered to return at a later time and [name] declined.   CSW asked about [name]'s PPD hx and [name] openly shared her experience.  [name] reported having consistent mood swings, not wanting to bond with infant, and daily sadness.  Per [name], [name] symptoms last until she was prescribed medication. [name] reported that was prescribed Zoloft when her first baby was a few months and she has continued to take "a low dose" until recently.  [name] shared that Zoloft is continuing to manage her symptoms and she has a great supportive team that will assist.  CSW offered [name] resources for outpatient counseling and [name] declined, however she reported "I am totally open to counseling if I feel like it is needed.   CSW provided education regarding the baby blues period vs. perinatal mood disorders, discussed treatment and gave resources for mental health follow up if concerns arise.  CSW recommends self-evaluation during the postpartum time period using the New Mom Checklist from Postpartum Progress and encouraged [name] to contact a medical professional if symptoms are noted at any time. [name] presented with insight and awareness and did not display any acute MH symptoms. CSW assessed for safety and [name] denied SI, HI, and DV.     CSW identifies no further need for intervention and no barriers to discharge at this time.  Laurie Hicks, MSW, LCSW Clinical Social Work [phone]  

## 2020-11-07 ENCOUNTER — Inpatient Hospital Stay (HOSPITAL_COMMUNITY)
Admission: RE | Admit: 2020-11-07 | Payer: BC Managed Care – PPO | Source: Home / Self Care | Admitting: Obstetrics & Gynecology

## 2020-11-07 LAB — SURGICAL PATHOLOGY

## 2020-11-07 MED ORDER — IBUPROFEN 600 MG PO TABS: 600.0000 mg | ORAL_TABLET | Freq: Four times a day (QID) | ORAL | 0 refills | Status: DC | PRN

## 2020-11-07 MED ORDER — ACETAMINOPHEN 500 MG PO TABS: 1000.0000 mg | ORAL_TABLET | Freq: Four times a day (QID) | ORAL | 0 refills | Status: DC | PRN

## 2020-11-07 NOTE — Discharge Summary (Signed)
Postpartum Discharge Summary  Date of Service updated7/8/22     Patient Name: Laurie Hicks DOB: [birth year] MRN: [id number]  Date of admission: 11/05/2020 Delivery date:11/05/2020  Delivering provider: Tyson Dense  Date of discharge: 11/07/2020  Admitting diagnosis: Previous cesarean section [Z98.891] Intrauterine pregnancy: [redacted]w[redacted]d    Secondary diagnosis:  Active Problems:   Previous cesarean section  Additional problems:     Discharge diagnosis: Term Pregnancy Delivered                                              Post partum procedures: Augmentation:  Complications: None  Hospital course: Sceduled C/S   40y.o. yo [id number] 38w6das admitted to the hospital 11/05/2020 for scheduled cesarean section with the following indication:Elective Repeat.Delivery details are as follows:  Membrane Rupture Time/Date: 5:00 AM ,11/05/2020   Delivery Method:C-Section, Low Transverse  Details of operation can be found in separate operative note.  Patient had an uncomplicated postpartum course.  She is ambulating, tolerating a regular diet, passing flatus, and urinating well. Patient is discharged home in stable condition on  11/07/20        Newborn Data: Birth date:11/05/2020  Birth time:12:09 PM  Gender:Female  Living status:Living  Apgars:9 ,9  Weight:4050 g     Magnesium Sulfate received: No BMZ received: No Rhophylac:No MMR:No T-DaP:Given prenatally Flu: No Transfusion:No  Physical exam  Vitals:   11/06/20 0500 11/06/20 1412 11/06/20 2358 11/07/20 0500  BP: 101/66 (!) 119/53 (!) 114/54 (!) 117/59  Pulse: 73 71 74 74  Resp: _0 Temp: 98.1 F (36.7 C) 98.1 F (36.7 C) 98.6 F (37 C) 97.8 F (36.6 C)  TempSrc: Oral Oral Oral Oral  SpO2:  100% 100% 99%   General: alert, cooperative, and no distress Lochia: appropriate Uterine Fundus: firm Incision: Healing well with no significant drainage DVT Evaluation: No evidence of DVT seen on physical  exam. Labs: Lab Results  Component Value Date   WBC 13.8 (H) 11/06/2020   HGB 8.9 (L) 11/06/2020   HCT 28.2 (L) 11/06/2020   MCV 90.1 11/06/2020   PLT 248 11/06/2020   CMP Latest Ref Rng & Units 03/13/2019  Glucose 70 - 99 mg/dL 93  BUN 6 - 20 mg/dL 6  Creatinine 0.44 - 1.00 mg/dL 0.65  Sodium 135 - 145 mmol/L 136  Potassium 3.5 - 5.1 mmol/L 4.2  Chloride 98 - 111 mmol/L 105  CO2 22 - 32 mmol/L 19(L)  Calcium 8.9 - 10.3 mg/dL 9.1  Total Protein 6.5 - 8.1 g/dL 6.2(L)  Total Bilirubin 0.3 - 1.2 mg/dL 0.4  Alkaline Phos 38 - 126 U/L 193(H)  AST 15 - 41 U/L 28  ALT 0 - 44 U/L 11   Edinburgh Score: Edinburgh Postnatal Depression Scale Screening Tool 11/06/2020  I have been able to laugh and see the funny side of things. 0  I have looked forward with enjoyment to things. 0  I have blamed myself unnecessarily when things went wrong. 1  I have been anxious or worried for no good reason. 2  I have felt scared or panicky for no good reason. 0  Things have been getting on top of me. 1  I have been so unhappy that I have had difficulty sleeping. 0  I have felt sad or miserable. 0  I have been  so unhappy that I have been crying. 1  The thought of harming myself has occurred to me. 0  Edinburgh Postnatal Depression Scale Total 5      After visit meds:     Discharge home in stable condition Infant Feeding: Breast Infant Disposition:home with mother Discharge instruction: per After Visit Summary and Postpartum booklet. Activity: Advance as tolerated. Pelvic rest for 6 weeks.  Diet: routine diet Anticipated Birth Control: Unsure Postpartum Appointment:6 weeks Additional Postpartum F/U:  Future Appointments:No future appointments. Follow up Visit:      11/07/2020 Allena Katz, MD

## 2020-11-07 NOTE — Lactation Note (Signed)
This note was copied from a baby's chart. Lactation Consultation Note  Patient Name: Laurie Hicks URKYH'C Date: 11/07/2020 Reason for consult: Follow-up assessment Age:38 hours  Mother observed with infant latched on when [facility] arrived to the room. Mother reports that she hears infant swallow and sometimes hears a clicking sound. Observed infant with consistent audible swallows . Assist with flanging upper lip. Lower lip flanged with chin down. Mother reports that she is not sore. Infant still breastfeeding after 20 mins. Observed slight clicking sound a few times, but infant sustained latch well with frequent swallow.  Discharge to be delayed because infant is jaundice.  Mother unsure when going home.  Discussed treatment and prevention of engorgement.  Discussed available hand pump if needed .mother declined. Suggested Hand expressing and spoon feeding if infant becomes sleepy. Mother is aware of available [place] services.   Maternal Data    Feeding Mother's Current Feeding Choice: Breast Milk  LATCH Score Latch: Grasps breast easily, tongue down, lips flanged, rhythmical sucking.  Audible Swallowing: Spontaneous and intermittent  Type of Nipple: Everted at rest and after stimulation  Comfort (Breast/Nipple): Soft / non-tender  Hold (Positioning): No assistance needed to correctly position infant at breast.  LATCH Score: 10   Lactation Tools Discussed/Used    Interventions Interventions: Breast compression;Position options (mother declined hand pump at this time)  Discharge Discharge Education: Engorgement and breast care;Warning signs for feeding baby;Outpatient recommendation  Consult Status Consult Status: Complete (infant jaundice and discharge delayed)    Darla Lesches 11/07/2020, 1:52 PM

## 2020-11-13 ENCOUNTER — Inpatient Hospital Stay (HOSPITAL_COMMUNITY): Admit: 2020-11-13 | Payer: Self-pay

## 2020-11-18 ENCOUNTER — Telehealth (HOSPITAL_COMMUNITY): Payer: Self-pay | Admitting: *Deleted

## 2020-11-18 NOTE — Telephone Encounter (Signed)
Left message to return nurse call.  Odis Hollingshead, RN 11/18/2020 at 10:33am

## 2022-02-11 ENCOUNTER — Other Ambulatory Visit: Payer: Self-pay | Admitting: Obstetrics & Gynecology

## 2022-02-11 DIAGNOSIS — R591 Generalized enlarged lymph nodes: Secondary | ICD-10-CM

## 2022-02-18 ENCOUNTER — Ambulatory Visit
Admission: RE | Admit: 2022-02-18 | Discharge: 2022-02-18 | Disposition: A | Discharge status: Home / Self Care | Payer: BC Managed Care – PPO | Source: Ambulatory Visit | Attending: Obstetrics & Gynecology | Admitting: Obstetrics & Gynecology

## 2022-02-18 DIAGNOSIS — R591 Generalized enlarged lymph nodes: Secondary | ICD-10-CM

## 2022-07-16 ENCOUNTER — Ambulatory Visit: Payer: BC Managed Care – PPO | Admitting: Podiatry

## 2022-07-16 ENCOUNTER — Ambulatory Visit (INDEPENDENT_AMBULATORY_CARE_PROVIDER_SITE_OTHER): Payer: BC Managed Care – PPO

## 2022-07-16 DIAGNOSIS — L600 Ingrowing nail: Secondary | ICD-10-CM | POA: Diagnosis not present

## 2022-07-16 DIAGNOSIS — T1490XA Injury, unspecified, initial encounter: Secondary | ICD-10-CM

## 2022-07-16 DIAGNOSIS — M79672 Pain in left foot: Secondary | ICD-10-CM | POA: Diagnosis not present

## 2022-07-16 DIAGNOSIS — M795 Residual foreign body in soft tissue: Secondary | ICD-10-CM

## 2022-07-16 DIAGNOSIS — B49 Unspecified mycosis: Secondary | ICD-10-CM | POA: Diagnosis not present

## 2022-07-16 MED ORDER — SULFAMETHOXAZOLE-TRIMETHOPRIM 800-160 MG PO TABS: 1.0000 | ORAL_TABLET | Freq: Two times a day (BID) | ORAL | 0 refills | Status: DC

## 2022-07-16 NOTE — Progress Notes (Signed)
Subjective:   Patient ID: Laurie Hicks, female   DOB: 42 y.o.   MRN: [id number]   HPI Chief Complaint  Patient presents with   Foot Problem    Possible glass in the left ball of foot, callouse starting to form, possible ingrowns on bilateral feet, on 2nd and 4th digit   42 year old female presents the office with above concerns.  In the ball of the left foot she may have a piece of glass and is been there for quite some time.  She states that she has pain in the ball of her foot she is getting callus to this area.  No swelling or redness or any drainage.  No bleeding.  She also states that the second toe has been becoming ingrown.  No swelling redness or any drainage to the toenail sites.  No open lesions.  No other concerns.   Review of Systems  All other systems reviewed and are negative.  Past Medical History:  Diagnosis Date   Anxiety    Depression     Past Surgical History:  Procedure Laterality Date   CESAREAN SECTION N/A 03/13/2019   Procedure: CESAREAN SECTION;  Surgeon: Linda Hedges, DO;  Location: [facility];  Service: Obstetrics;  Laterality: N/A;   CESAREAN SECTION N/A 11/05/2020   Procedure: REPEAT CESAREAN SECTION EDC: 11-13-20 ALLERG: PENICILLINS;  Surgeon: Tyson Dense, MD;  Location: [place] LD ORS;  Service: Obstetrics;  Laterality: N/A;   FOOT SURGERY       Current Outpatient Medications:    acetaminophen (TYLENOL) 500 MG tablet, Take 2 tablets (1,000 mg total) by mouth every 6 (six) hours as needed., Disp: 30 tablet, Rfl: 0   ibuprofen (ADVIL) 600 MG tablet, Take 1 tablet (600 mg total) by mouth every 6 (six) hours as needed., Disp: 30 tablet, Rfl: 0   Prenatal Vit-Fe Fumarate-FA (PRENATAL MULTIVITAMIN) TABS tablet, Take 1 tablet by mouth daily., Disp: , Rfl:    sulfamethoxazole-trimethoprim (BACTRIM DS) 800-160 MG tablet, Take 1 tablet by mouth 2 (two) times daily., Disp: 14 tablet, Rfl: 0  Allergies  Allergen Reactions   Chloraprep One Step  [Chlorhexidine Gluconate] Hives   Penicillins Hives           Objective:  Physical Exam  General: AAO x3, NAD  Dermatological: Hyperkeratotic lesion noted to the plantar aspect of the left foot submetatarsal.  Upon debridement there is no underlying ulceration drainage or any signs of infection there is no evidence of puncture wound or foreign body.  Monitor patient presents second digit toenails with some slight edema with no drainage or pus or ascending cellulitis.  Nails are mildly hypertrophic, dystrophic with yellow discoloration and skin buildup under the toenail.  No signs of infection.  Vascular: Dorsalis Pedis artery and Posterior Tibial artery pedal pulses are 2/4 bilateral with immedate capillary fill time. There is no pain with calf compression, swelling, warmth, erythema.   Neruologic: Grossly intact via light touch bilateral.  Musculoskeletal: She has tenderness only.  The callus prior to debridement as well as mildly to the ingrown toenails  Gait: Unassisted, Nonantalgic.       Assessment:   Hyperkeratotic lesion, concern for possible foreign body; ingrown toenail; possible onychomycosis     Plan:  -Treatment options discussed including all alternatives, risks, and complications -Etiology of symptoms were discussed -X-rays were obtained reviewed.  3 views of the foot were obtained.  There is no acute fracture, foreign body. -Sharply debrided the hyperkeratotic lesion without any complications or  bleeding.  Not able to identify any puncture wound or foreign body.  Will monitor this and symptoms persist consider ultrasound. -Debrided nails and complications or bleeding.  Prescribed Bactrim. -Epsom salt soaks -Toenails sent for culture  -If symptoms persist with ingrown toenail need to proceed with partial nail avulsions.  Trula Slade DPM

## 2022-07-16 NOTE — Patient Instructions (Signed)

## 2023-03-23 ENCOUNTER — Other Ambulatory Visit: Payer: Self-pay | Admitting: Obstetrics & Gynecology

## 2023-03-23 DIAGNOSIS — E041 Nontoxic single thyroid nodule: Secondary | ICD-10-CM

## 2023-03-28 ENCOUNTER — Other Ambulatory Visit: Payer: Self-pay | Admitting: Obstetrics & Gynecology

## 2023-03-28 DIAGNOSIS — R928 Other abnormal and inconclusive findings on diagnostic imaging of breast: Secondary | ICD-10-CM

## 2023-03-29 ENCOUNTER — Ambulatory Visit
Admission: RE | Admit: 2023-03-29 | Discharge: 2023-03-29 | Disposition: A | Discharge status: Home / Self Care | Payer: BC Managed Care – PPO | Source: Ambulatory Visit | Attending: Obstetrics & Gynecology | Admitting: Obstetrics & Gynecology

## 2023-03-29 DIAGNOSIS — E041 Nontoxic single thyroid nodule: Secondary | ICD-10-CM

## 2023-04-07 ENCOUNTER — Other Ambulatory Visit: Payer: Self-pay | Admitting: Obstetrics & Gynecology

## 2023-04-07 ENCOUNTER — Encounter: Payer: Self-pay | Admitting: Obstetrics & Gynecology

## 2023-04-07 DIAGNOSIS — E041 Nontoxic single thyroid nodule: Secondary | ICD-10-CM

## 2023-04-13 ENCOUNTER — Other Ambulatory Visit (HOSPITAL_COMMUNITY)
Admission: RE | Admit: 2023-04-13 | Discharge: 2023-04-13 | Disposition: A | Discharge status: Home / Self Care | Payer: BC Managed Care – PPO | Source: Ambulatory Visit | Attending: Obstetrics & Gynecology | Admitting: Obstetrics & Gynecology

## 2023-04-13 ENCOUNTER — Inpatient Hospital Stay
Admission: RE | Admit: 2023-04-13 | Discharge: 2023-04-13 | Discharge status: Home / Self Care | Payer: BC Managed Care – PPO | Source: Ambulatory Visit | Attending: Obstetrics & Gynecology | Admitting: Obstetrics & Gynecology

## 2023-04-13 DIAGNOSIS — E041 Nontoxic single thyroid nodule: Secondary | ICD-10-CM | POA: Insufficient documentation

## 2023-04-15 LAB — CYTOLOGY - NON PAP

## 2023-04-19 ENCOUNTER — Ambulatory Visit
Admission: RE | Admit: 2023-04-19 | Discharge: 2023-04-19 | Disposition: A | Discharge status: Home / Self Care | Payer: BC Managed Care – PPO | Source: Ambulatory Visit | Attending: Obstetrics & Gynecology | Admitting: Obstetrics & Gynecology

## 2023-04-19 DIAGNOSIS — R928 Other abnormal and inconclusive findings on diagnostic imaging of breast: Secondary | ICD-10-CM

## 2023-04-26 ENCOUNTER — Encounter (HOSPITAL_COMMUNITY): Payer: Self-pay

## 2023-06-24 ENCOUNTER — Other Ambulatory Visit: Payer: Self-pay | Admitting: Otolaryngology

## 2023-07-14 NOTE — Pre-Procedure Instructions (Signed)
 Surgical Instructions   Your procedure is scheduled on Wednesday, March 19th. Report to [facility] Main Entrance "A" at 10:00 A.M., then check in with the Admitting office. Any questions or running late day of surgery: call [phone]  Questions prior to your surgery date: call [phone], Monday-Friday, 8am-4pm. If you experience any cold or flu symptoms such as cough, fever, chills, shortness of breath, etc. between now and your scheduled surgery, please notify us at the above number.     Remember:  Do not eat after midnight the night before your surgery   You may drink clear liquids until 09:00 AM the morning of your surgery.   Clear liquids allowed are: Water, Non-Citrus Juices (without pulp), Carbonated Beverages, Clear Tea (no milk, honey, etc.), Black Coffee Only (NO MILK, CREAM OR POWDERED CREAMER of any kind), and Gatorade.    Take these medicines the morning of surgery with A SIP OF WATER  SYEDA  TRINTELLIX     One week prior to surgery, STOP taking any Aspirin (unless otherwise instructed by your surgeon) Aleve, Naproxen, Ibuprofen, Motrin, Advil, Goody's, BC's, all herbal medications, fish oil, and non-prescription vitamins.                     Do NOT Smoke (Tobacco/Vaping) for 24 hours prior to your procedure.  If you use a CPAP at night, you may bring your mask/headgear for your overnight stay.   You will be asked to remove any contacts, glasses, piercing's, hearing aid's, dentures/partials prior to surgery. Please bring cases for these items if needed.    Patients discharged the day of surgery will not be allowed to drive home, and someone needs to stay with them for 24 hours.  SURGICAL WAITING ROOM VISITATION Patients may have no more than 2 support people in the waiting area - these visitors may rotate.   Pre-op nurse will coordinate an appropriate time for 1 ADULT support person, who may not rotate, to accompany patient in pre-op.  Children under the age  of 62 must have an adult with them who is not the patient and must remain in the main waiting area with an adult.  If the patient needs to stay at the hospital during part of their recovery, the visitor guidelines for inpatient rooms apply.  Please refer to the [facility] website for the visitor guidelines for any additional information.   If you received a COVID test during your pre-op visit  it is requested that you wear a mask when out in public, stay away from anyone that may not be feeling well and notify your surgeon if you develop symptoms. If you have been in contact with anyone that has tested positive in the last 10 days please notify you surgeon.      Pre-operative CHG Bathing Instructions   You can play a key role in reducing the risk of infection after surgery. Your skin needs to be as free of germs as possible. You can reduce the number of germs on your skin by washing with CHG (chlorhexidine gluconate) soap before surgery. CHG is an antiseptic soap that kills germs and continues to kill germs even after washing.   DO NOT use if you have an allergy to chlorhexidine/CHG or antibacterial soaps. If your skin becomes reddened or irritated, stop using the CHG and notify one of our RNs at [phone].              TAKE A SHOWER THE NIGHT BEFORE SURGERY  AND THE DAY OF SURGERY    Please keep in mind the following:  DO NOT shave, including legs and underarms, 48 hours prior to surgery.   You may shave your face before/day of surgery.  Place clean sheets on your bed the night before surgery Use a clean washcloth (not used since being washed) for each shower. DO NOT sleep with pet's night before surgery.  CHG Shower Instructions:  Wash your face and private area with normal soap. If you choose to wash your hair, wash first with your normal shampoo.  After you use shampoo/soap, rinse your hair and body thoroughly to remove shampoo/soap residue.  Turn the water OFF and apply half the  bottle of CHG soap to a CLEAN washcloth.  Apply CHG soap ONLY FROM YOUR NECK DOWN TO YOUR TOES (washing for 3-5 minutes)  DO NOT use CHG soap on face, private areas, open wounds, or sores.  Pay special attention to the area where your surgery is being performed.  If you are having back surgery, having someone wash your back for you may be helpful. Wait 2 minutes after CHG soap is applied, then you may rinse off the CHG soap.  Pat dry with a clean towel  Put on clean pajamas    Additional instructions for the day of surgery: DO NOT APPLY any lotions, deodorants, cologne, or perfumes.   Do not wear jewelry or makeup Do not wear nail polish, gel polish, artificial nails, or any other type of covering on natural nails (fingers and toes) Do not bring valuables to the hospital. [facility] is not responsible for valuables/personal belongings. Put on clean/comfortable clothes.  Please brush your teeth.  Ask your nurse before applying any prescription medications to the skin.

## 2023-07-15 ENCOUNTER — Other Ambulatory Visit: Payer: Self-pay

## 2023-07-15 ENCOUNTER — Encounter (HOSPITAL_COMMUNITY): Payer: Self-pay | Admitting: Urology

## 2023-07-15 ENCOUNTER — Encounter (HOSPITAL_COMMUNITY)
Admission: RE | Admit: 2023-07-15 | Discharge: 2023-07-15 | Disposition: A | Discharge status: Home / Self Care | Source: Ambulatory Visit | Attending: Otolaryngology | Admitting: Otolaryngology

## 2023-07-15 VITALS — BP 151/83 | HR 110 | Temp 98.3°F | Resp 17 | Ht 65.0 in | Wt 163.0 lb

## 2023-07-15 DIAGNOSIS — F32A Depression, unspecified: Secondary | ICD-10-CM | POA: Diagnosis not present

## 2023-07-15 DIAGNOSIS — I2510 Atherosclerotic heart disease of native coronary artery without angina pectoris: Secondary | ICD-10-CM | POA: Insufficient documentation

## 2023-07-15 DIAGNOSIS — F909 Attention-deficit hyperactivity disorder, unspecified type: Secondary | ICD-10-CM | POA: Diagnosis not present

## 2023-07-15 DIAGNOSIS — F419 Anxiety disorder, unspecified: Secondary | ICD-10-CM | POA: Diagnosis not present

## 2023-07-15 DIAGNOSIS — Z01818 Encounter for other preprocedural examination: Secondary | ICD-10-CM | POA: Diagnosis present

## 2023-07-15 DIAGNOSIS — R7303 Prediabetes: Secondary | ICD-10-CM | POA: Diagnosis not present

## 2023-07-15 DIAGNOSIS — I10 Essential (primary) hypertension: Secondary | ICD-10-CM | POA: Insufficient documentation

## 2023-07-15 DIAGNOSIS — D497 Neoplasm of unspecified behavior of endocrine glands and other parts of nervous system: Secondary | ICD-10-CM | POA: Insufficient documentation

## 2023-07-15 DIAGNOSIS — Z01812 Encounter for preprocedural laboratory examination: Secondary | ICD-10-CM | POA: Insufficient documentation

## 2023-07-15 HISTORY — DX: Attention-deficit hyperactivity disorder, unspecified type: F90.9

## 2023-07-15 HISTORY — DX: Essential (primary) hypertension: I10

## 2023-07-15 HISTORY — DX: Headache, unspecified: R51.9

## 2023-07-15 HISTORY — DX: Prediabetes: R73.03

## 2023-07-15 LAB — CBC
HCT: 38.5 % (ref 36.0–46.0)
Hemoglobin: 12.9 g/dL (ref 12.0–15.0)
MCH: 29.9 pg (ref 26.0–34.0)
MCHC: 33.5 g/dL (ref 30.0–36.0)
MCV: 89.1 fL (ref 80.0–100.0)
Platelets: 405 10*3/uL — ABNORMAL HIGH (ref 150–400)
RBC: 4.32 MIL/uL (ref 3.87–5.11)
RDW: 12.2 % (ref 11.5–15.5)
WBC: 6.8 10*3/uL (ref 4.0–10.5)
nRBC: 0 % (ref 0.0–0.2)

## 2023-07-15 NOTE — Progress Notes (Signed)
 PCP - Caryl Comes, FNP Cardiologist - Dr. Fawn Kirk  PPM/ICD - denies   Chest x-ray - n/A EKG - 07/07/23- in care everywhere- requested tracing from PCP Stress Test - denies ECHO - had ECHO on 07/14/23 per patient. Per pt everything was normal. Report requested from cardiologist via fax Cardiac Cath - denies  Sleep Study - denies   Fasting Blood Sugar - A1c 6.0 this month. Pt reports she is pre-DM, but not taking any medications.   Last dose of GLP1 agonist-  N/A   Blood Thinner Instructions: N/A Aspirin Instructions:N/A  ERAS Protcol - ERAS per order   COVID TEST- N/A   Anesthesia review: yes- follow up on requested EKG and ECHO. Pt is checking her BP at home and started taking amlodipine 2.5mg  on Tuesday 07/12/23. Pt reports her BP has been around 130/80 when checking at home. Pt takes her daily medications at night.   Patient denies shortness of breath, fever, cough and chest pain at PAT appointment   All instructions explained to the patient, with a verbal understanding of the material. Patient agrees to go over the instructions while at home for a better understanding. The opportunity to ask questions was provided.

## 2023-07-15 NOTE — Pre-Procedure Instructions (Signed)
 Surgical Instructions   Your procedure is scheduled on Wednesday, March 19th. Report to [facility] Main Entrance "A" at 0730 A.M., then check in with the Admitting office. Any questions or running late day of surgery: call [phone]  Questions prior to your surgery date: call [phone], Monday-Friday, 8am-4pm. If you experience any cold or flu symptoms such as cough, fever, chills, shortness of breath, etc. between now and your scheduled surgery, please notify us at the above number.     Remember:  Do not eat after midnight the night before your surgery   You may drink clear liquids until 0630 AM the morning of your surgery.   Clear liquids allowed are: Water, Non-Citrus Juices (without pulp), Carbonated Beverages, Clear Tea (no milk, honey, etc.), Black Coffee Only (NO MILK, CREAM OR POWDERED CREAMER of any kind), and Gatorade.    Take these medicines the morning of surgery with A SIP OF WATER  Tylenol if needed    One week prior to surgery, STOP taking any Aspirin (unless otherwise instructed by your surgeon) Aleve, Naproxen, Ibuprofen, Motrin, Advil, Goody's, BC's, all herbal medications, fish oil, and non-prescription vitamins.                     Do NOT Smoke (Tobacco/Vaping) for 24 hours prior to your procedure.  If you use a CPAP at night, you may bring your mask/headgear for your overnight stay.   You will be asked to remove any contacts, glasses, piercing's, hearing aid's, dentures/partials prior to surgery. Please bring cases for these items if needed.    Patients discharged the day of surgery will not be allowed to drive home, and someone needs to stay with them for 24 hours.  SURGICAL WAITING ROOM VISITATION Patients may have no more than 2 support people in the waiting area - these visitors may rotate.   Pre-op nurse will coordinate an appropriate time for 1 ADULT support person, who may not rotate, to accompany patient in pre-op.  Children under the age of 71  must have an adult with them who is not the patient and must remain in the main waiting area with an adult.  If the patient needs to stay at the hospital during part of their recovery, the visitor guidelines for inpatient rooms apply.  Please refer to the [facility] website for the visitor guidelines for any additional information.   If you received a COVID test during your pre-op visit  it is requested that you wear a mask when out in public, stay away from anyone that may not be feeling well and notify your surgeon if you develop symptoms. If you have been in contact with anyone that has tested positive in the last 10 days please notify you surgeon.      Pre-operative DIAL/antibacterial soap Bathing Instructions   You can play a key role in reducing the risk of infection after surgery. Your skin needs to be as free of germs as possible. You can reduce the number of germs on your skin by washing with CHG (chlorhexidine gluconate) soap before surgery. CHG is an antiseptic soap that kills germs and continues to kill germs even after washing.   DO NOT use if you have an allergy to chlorhexidine/CHG or antibacterial soaps. If your skin becomes reddened or irritated, stop using the CHG and notify one of our RNs at [phone].              TAKE A SHOWER THE NIGHT BEFORE SURGERY  AND THE DAY OF SURGERY    Please keep in mind the following:  DO NOT shave, including legs and underarms, 48 hours prior to surgery.   You may shave your face before/day of surgery.  Place clean sheets on your bed the night before surgery Use a clean washcloth (not used since being washed) for each shower. DO NOT sleep with pet's night before surgery.  CHG Shower Instructions:  Wash your face and private area with normal soap. If you choose to wash your hair, wash first with your normal shampoo.  After you use shampoo/soap, rinse your hair and body thoroughly to remove shampoo/soap residue.  Turn the water OFF and  apply half the bottle of CHG soap to a CLEAN washcloth.  Apply CHG soap ONLY FROM YOUR NECK DOWN TO YOUR TOES (washing for 3-5 minutes)  DO NOT use CHG soap on face, private areas, open wounds, or sores.  Pay special attention to the area where your surgery is being performed.  If you are having back surgery, having someone wash your back for you may be helpful. Wait 2 minutes after CHG soap is applied, then you may rinse off the CHG soap.  Pat dry with a clean towel  Put on clean pajamas    Additional instructions for the day of surgery: DO NOT APPLY any lotions, deodorants, cologne, or perfumes.   Do not wear jewelry or makeup Do not wear nail polish, gel polish, artificial nails, or any other type of covering on natural nails (fingers and toes) Do not bring valuables to the hospital. [facility] is not responsible for valuables/personal belongings. Put on clean/comfortable clothes.  Please brush your teeth.  Ask your nurse before applying any prescription medications to the skin.

## 2023-07-18 ENCOUNTER — Encounter (HOSPITAL_COMMUNITY): Payer: Self-pay

## 2023-07-18 NOTE — Anesthesia Preprocedure Evaluation (Signed)
 Anesthesia Evaluation  Patient identified by MRN, date of birth, ID band Patient awake    Reviewed: Allergy & Precautions, NPO status , Patient's Chart, lab work & pertinent test results  History of Anesthesia Complications Negative for: history of anesthetic complications  Airway Mallampati: II  TM Distance: >3 FB Neck ROM: Full    Dental no notable dental hx. (+) Dental Advisory Given, Teeth Intact   Pulmonary neg pulmonary ROS   Pulmonary exam normal        Cardiovascular hypertension, Pt. on medications Normal cardiovascular exam Rhythm:Regular Rate:Normal     Neuro/Psych  PSYCHIATRIC DISORDERS Anxiety Depression    negative neurological ROS     GI/Hepatic negative GI ROS, Neg liver ROS,,,  Endo/Other   Obesity   Renal/GU negative Renal ROS     Musculoskeletal negative musculoskeletal ROS (+)    Abdominal   Peds  Hematology negative hematology ROS (+)  Plt 338k    Anesthesia Other Findings   Reproductive/Obstetrics (+) Pregnancy                             Anesthesia Physical Anesthesia Plan  ASA: 2  Anesthesia Plan: General   Post-op Pain Management: Tylenol PO (pre-op)* and Precedex   Induction: Intravenous  PONV Risk Score and Plan: 4 or greater and Treatment may vary due to age or medical condition, Ondansetron, Scopolamine patch - Pre-op, Dexamethasone and Midazolam  Airway Management Planned: Oral ETT  Additional Equipment: None  Intra-op Plan:   Post-operative Plan: Extubation in OR  Informed Consent: I have reviewed the patients History and Physical, chart, labs and discussed the procedure including the risks, benefits and alternatives for the proposed anesthesia with the patient or authorized representative who has indicated his/her understanding and acceptance.     Dental advisory given  Plan Discussed with: CRNA  Anesthesia Plan Comments:  (Remifentanil infusion  See PAT note from 3/14 by Sherlie Ban PA-C )        Anesthesia Quick Evaluation

## 2023-07-18 NOTE — Progress Notes (Addendum)
 Case: [id number] Date/Time: 07/20/23 0915   Procedure: HEMITHYROIDECTOMY (Left)   Anesthesia type: General   Pre-op diagnosis:      Thyroid neoplasm     Dysphagia, unspecified type   [facility] ROOM 09 / [facility]   Surgeons: Ginger Carne, MD       DISCUSSION: Laurie Hicks is a 43 yo female who presents to PAT prior to surgery above. PMH of HTN, anxiety, depression, prediabetes (A1c 6.0), ADHD  Patient seen by PCP for pre op evaluation on 07/07/23. EKG showed NSR with LAE and it was recommended she see Cardiology. Initial consult with Cards on 07/12/23. She was started on low dose Amlodipine and echo was obtained which came back normal. Cleared for surgery:  "Overall risk of perioperative major adverse cardiac events is low."  VS: BP (!) 151/83   Pulse (!) 110   Temp 36.8 C   Resp 17   Ht 5\' 5"  (1.651 m)   Wt 73.9 kg   LMP 06/22/2023   SpO2 100%   BMI 27.12 kg/m   PROVIDERS: Corrington, Kip A, MD   LABS: Labs reviewed: Acceptable for surgery. (all labs ordered are listed, but only abnormal results are displayed)  Labs Reviewed  CBC - Abnormal; Notable for the following components:      Result Value   Platelets 405 (*)    All other components within normal limits     IMAGES:   EKG 07/07/23:  NSR LAE   CV: Echo 07/14/23 ([facility]):  Impression Left Ventricle: Left ventricle size is normal.   Left Ventricle: Systolic function is normal. EF: 55-60%.   Right Ventricle: Right ventricle size is normal.   Right Ventricle: Systolic function is normal. Past Medical History:  Diagnosis Date   ADHD (attention deficit hyperactivity disorder)    Anxiety    Depression    Headache    Hypertension    Pre-diabetes     Past Surgical History:  Procedure Laterality Date   CESAREAN SECTION N/A 03/13/2019   Procedure: CESAREAN SECTION;  Surgeon: Mitchel Honour, DO;  [facility] LD [facility];  Service: Obstetrics;  Laterality: N/A;   CESAREAN SECTION N/A 11/05/2020    Procedure: REPEAT CESAREAN SECTION EDC: 11-13-20 ALLERG: PENICILLINS;  Surgeon: Ranae Pila, MD;  Location: [facility];  Service: Obstetrics;  Laterality: N/A;   FOOT SURGERY Left     MEDICATIONS:  Acetaminophen (TYLENOL PO)   amLODipine (NORVASC) 2.5 MG tablet   dextroamphetamine (DEXEDRINE SPANSULE) 15 MG 24 hr capsule   SYEDA 3-0.03 MG tablet   TRINTELLIX 20 MG TABS tablet   No current facility-administered medications for this encounter.   [name] [facility] Surgical Short Stay/[facility] Phone [phone] 07/18/2023 12:52 PM

## 2023-07-20 ENCOUNTER — Other Ambulatory Visit: Payer: Self-pay

## 2023-07-20 ENCOUNTER — Ambulatory Visit (HOSPITAL_COMMUNITY)
Admission: RE | Admit: 2023-07-20 | Discharge: 2023-07-20 | Disposition: A | Discharge status: Home / Self Care | Payer: BC Managed Care – PPO | Attending: Otolaryngology | Admitting: Otolaryngology

## 2023-07-20 ENCOUNTER — Encounter (HOSPITAL_COMMUNITY)
Admission: RE | Disposition: A | Discharge status: Home / Self Care | Payer: Self-pay | Source: Home / Self Care | Attending: Otolaryngology

## 2023-07-20 ENCOUNTER — Encounter (HOSPITAL_COMMUNITY): Payer: Self-pay | Admitting: Otolaryngology

## 2023-07-20 ENCOUNTER — Ambulatory Visit (HOSPITAL_COMMUNITY): Payer: Self-pay | Admitting: Medical

## 2023-07-20 ENCOUNTER — Ambulatory Visit (HOSPITAL_COMMUNITY): Payer: Self-pay

## 2023-07-20 DIAGNOSIS — Z79899 Other long term (current) drug therapy: Secondary | ICD-10-CM | POA: Insufficient documentation

## 2023-07-20 DIAGNOSIS — R1310 Dysphagia, unspecified: Secondary | ICD-10-CM | POA: Insufficient documentation

## 2023-07-20 DIAGNOSIS — D497 Neoplasm of unspecified behavior of endocrine glands and other parts of nervous system: Secondary | ICD-10-CM | POA: Diagnosis present

## 2023-07-20 DIAGNOSIS — F419 Anxiety disorder, unspecified: Secondary | ICD-10-CM | POA: Insufficient documentation

## 2023-07-20 DIAGNOSIS — F909 Attention-deficit hyperactivity disorder, unspecified type: Secondary | ICD-10-CM | POA: Insufficient documentation

## 2023-07-20 DIAGNOSIS — I10 Essential (primary) hypertension: Secondary | ICD-10-CM | POA: Diagnosis not present

## 2023-07-20 DIAGNOSIS — C73 Malignant neoplasm of thyroid gland: Secondary | ICD-10-CM | POA: Diagnosis not present

## 2023-07-20 DIAGNOSIS — E063 Autoimmune thyroiditis: Secondary | ICD-10-CM | POA: Diagnosis not present

## 2023-07-20 DIAGNOSIS — F32A Depression, unspecified: Secondary | ICD-10-CM | POA: Insufficient documentation

## 2023-07-20 HISTORY — PX: THYROIDECTOMY: SHX17

## 2023-07-20 LAB — POCT PREGNANCY, URINE: Preg Test, Ur: NEGATIVE

## 2023-07-20 SURGERY — THYROIDECTOMY
Anesthesia: General | Site: Neck | Laterality: Left

## 2023-07-20 MED ORDER — PROPOFOL 10 MG/ML IV BOLUS
INTRAVENOUS | Status: DC | PRN
  Administered 2023-07-20: 200 mg via INTRAVENOUS

## 2023-07-20 MED ORDER — FENTANYL CITRATE (PF) 250 MCG/5ML IJ SOLN
INTRAMUSCULAR | Status: DC | PRN
  Administered 2023-07-20: 150 ug via INTRAVENOUS

## 2023-07-20 MED ORDER — HYDROMORPHONE HCL 1 MG/ML IJ SOLN
0.2500 mg | INTRAMUSCULAR | Status: DC | PRN
  Administered 2023-07-20 (×3): 0.5 mg via INTRAVENOUS

## 2023-07-20 MED ORDER — DEXMEDETOMIDINE HCL IN NACL 400 MCG/100ML IV SOLN
INTRAVENOUS | Status: DC | PRN
  Administered 2023-07-20: 8 ug via INTRAVENOUS

## 2023-07-20 MED ORDER — PROPOFOL 500 MG/50ML IV EMUL
INTRAVENOUS | Status: DC | PRN
  Administered 2023-07-20: 50 ug/kg/min via INTRAVENOUS

## 2023-07-20 MED ORDER — ACETAMINOPHEN 500 MG PO TABS: 1000.0000 mg | ORAL_TABLET | Freq: Once | ORAL | Status: DC

## 2023-07-20 MED ORDER — OXYCODONE HCL 5 MG/5ML PO SOLN
5.0000 mg | Freq: Once | ORAL | Status: AC | PRN
  Administered 2023-07-20: 5 mg via ORAL

## 2023-07-20 MED ORDER — PROPOFOL 1000 MG/100ML IV EMUL
INTRAVENOUS | Status: AC
Start: 2023-07-20 — End: ?
  Filled 2023-07-20: qty 100

## 2023-07-20 MED ORDER — LIDOCAINE-EPINEPHRINE 1 %-1:100000 IJ SOLN
INTRAMUSCULAR | Status: AC
  Filled 2023-07-20: qty 1

## 2023-07-20 MED ORDER — DEXAMETHASONE SODIUM PHOSPHATE 10 MG/ML IJ SOLN
INTRAMUSCULAR | Status: DC | PRN
  Administered 2023-07-20: 10 mg via INTRAVENOUS

## 2023-07-20 MED ORDER — MIDAZOLAM HCL 5 MG/5ML IJ SOLN
INTRAMUSCULAR | Status: DC | PRN
  Administered 2023-07-20: 2 mg via INTRAVENOUS

## 2023-07-20 MED ORDER — MIDAZOLAM HCL 2 MG/2ML IJ SOLN
INTRAMUSCULAR | Status: AC
Start: 2023-07-20 — End: ?
  Filled 2023-07-20: qty 2

## 2023-07-20 MED ORDER — DEXAMETHASONE SODIUM PHOSPHATE 10 MG/ML IJ SOLN
INTRAMUSCULAR | Status: AC
Start: 2023-07-20 — End: ?
  Filled 2023-07-20: qty 1

## 2023-07-20 MED ORDER — HYDROMORPHONE HCL 1 MG/ML IJ SOLN
INTRAMUSCULAR | Status: AC
  Filled 2023-07-20: qty 1

## 2023-07-20 MED ORDER — PROPOFOL 10 MG/ML IV BOLUS
INTRAVENOUS | Status: AC
  Filled 2023-07-20: qty 20

## 2023-07-20 MED ORDER — SODIUM CHLORIDE 0.9 % IV SOLN
0.0125 ug/kg/min | Freq: Once | INTRAVENOUS | Status: AC
  Administered 2023-07-20: .05 ug/kg/min via INTRAVENOUS
  Filled 2023-07-20: qty 2000

## 2023-07-20 MED ORDER — DROPERIDOL 2.5 MG/ML IJ SOLN: 0.6250 mg | Freq: Once | INTRAMUSCULAR | Status: DC | PRN

## 2023-07-20 MED ORDER — ONDANSETRON HCL 4 MG/2ML IJ SOLN
INTRAMUSCULAR | Status: DC | PRN
  Administered 2023-07-20: 4 mg via INTRAVENOUS

## 2023-07-20 MED ORDER — OXYCODONE HCL 5 MG PO TABS: 5.0000 mg | ORAL_TABLET | Freq: Once | ORAL | Status: AC | PRN

## 2023-07-20 MED ORDER — SUCCINYLCHOLINE CHLORIDE 200 MG/10ML IV SOSY
PREFILLED_SYRINGE | INTRAVENOUS | Status: DC | PRN
  Administered 2023-07-20: 80 mg via INTRAVENOUS

## 2023-07-20 MED ORDER — LIDOCAINE 2% (20 MG/ML) 5 ML SYRINGE
INTRAMUSCULAR | Status: DC | PRN
  Administered 2023-07-20: 80 mg via INTRAVENOUS

## 2023-07-20 MED ORDER — ORAL CARE MOUTH RINSE
15.0000 mL | Freq: Once | OROMUCOSAL | Status: AC
  Administered 2023-07-20: 15 mL via OROMUCOSAL

## 2023-07-20 MED ORDER — 0.9 % SODIUM CHLORIDE (POUR BTL) OPTIME
TOPICAL | Status: DC | PRN
  Administered 2023-07-20: 1000 mL

## 2023-07-20 MED ORDER — OXYCODONE HCL 5 MG/5ML PO SOLN
ORAL | Status: AC
  Filled 2023-07-20: qty 5

## 2023-07-20 MED ORDER — LACTATED RINGERS IV SOLN: INTRAVENOUS | Status: DC

## 2023-07-20 MED ORDER — LIDOCAINE-EPINEPHRINE 1 %-1:100000 IJ SOLN
INTRAMUSCULAR | Status: DC | PRN
  Administered 2023-07-20: 10 mL

## 2023-07-20 MED ORDER — MEPERIDINE HCL 25 MG/ML IJ SOLN
6.2500 mg | INTRAMUSCULAR | Status: DC | PRN
Start: 2023-07-20 — End: 2023-07-20

## 2023-07-20 MED ORDER — ONDANSETRON HCL 4 MG/2ML IJ SOLN
INTRAMUSCULAR | Status: AC
  Filled 2023-07-20: qty 2

## 2023-07-20 MED ORDER — PHENYLEPHRINE HCL-NACL 20-0.9 MG/250ML-% IV SOLN
INTRAVENOUS | Status: DC | PRN
  Administered 2023-07-20: 80 ug via INTRAVENOUS
  Administered 2023-07-20: 20 ug/min via INTRAVENOUS

## 2023-07-20 MED ORDER — FENTANYL CITRATE (PF) 250 MCG/5ML IJ SOLN
INTRAMUSCULAR | Status: AC
  Filled 2023-07-20: qty 5

## 2023-07-20 SURGICAL SUPPLY — 27 items
CANISTER SUCT 3000ML PPV (MISCELLANEOUS) ×1 IMPLANT
CNTNR URN SCR LID CUP LEK RST (MISCELLANEOUS) IMPLANT
CORD BIPOLAR FORCEPS 12FT (ELECTRODE) ×1 IMPLANT
COVER SURGICAL LIGHT HANDLE (MISCELLANEOUS) ×1 IMPLANT
DERMABOND ADVANCED .7 DNX12 (GAUZE/BANDAGES/DRESSINGS) ×1 IMPLANT
DISSECTOR SURG LIGASURE 21 (MISCELLANEOUS) IMPLANT
ELECT COATED BLADE 2.86 ST (ELECTRODE) ×1 IMPLANT
ELECT REM PT RETURN 9FT ADLT (ELECTROSURGICAL) ×1 IMPLANT
ELECTRODE REM PT RTRN 9FT ADLT (ELECTROSURGICAL) ×1 IMPLANT
FORCEPS BIPOLAR SPETZLER 8 1.0 (NEUROSURGERY SUPPLIES) ×1 IMPLANT
GLOVE BIO SURGEON STRL SZ7.5 (GLOVE) ×1 IMPLANT
GOWN STRL REUS W/ TWL LRG LVL3 (GOWN DISPOSABLE) ×2 IMPLANT
KIT BASIN OR (CUSTOM PROCEDURE TRAY) ×1 IMPLANT
KIT TURNOVER KIT B (KITS) ×1 IMPLANT
NDL HYPO 25GX1X1/2 BEV (NEEDLE) ×1 IMPLANT
NEEDLE HYPO 25GX1X1/2 BEV (NEEDLE) ×1 IMPLANT
NS IRRIG 1000ML POUR BTL (IV SOLUTION) ×1 IMPLANT
PAD ARMBOARD POSITIONER FOAM (MISCELLANEOUS) ×2 IMPLANT
PENCIL SMOKE EVACUATOR (MISCELLANEOUS) ×1 IMPLANT
PROBE NERVBE PRASS .33 (MISCELLANEOUS) IMPLANT
SPONGE INTESTINAL PEANUT (DISPOSABLE) IMPLANT
SUT MNCRL AB 4-0 PS2 18 (SUTURE) IMPLANT
SUT SILK 3 0 REEL (SUTURE) ×1 IMPLANT
SUT VIC AB 3-0 SH 27X BRD (SUTURE) ×1 IMPLANT
TOWEL GREEN STERILE FF (TOWEL DISPOSABLE) ×1 IMPLANT
TRAY ENT MC OR (CUSTOM PROCEDURE TRAY) ×1 IMPLANT
TUBE ENDOTRAC NIMS EMG 7MM (MISCELLANEOUS) IMPLANT

## 2023-07-20 NOTE — H&P (Signed)
 Laurie Hicks is an 43 y.o. female.    Chief Complaint:  thyroid nodule  HPI: Patient presents today for planned elective procedure.  He/she denies any interval change in history since office visit  Past Medical History:  Diagnosis Date   ADHD (attention deficit hyperactivity disorder)    Anxiety    Depression    Headache    Hypertension    Pre-diabetes     Past Surgical History:  Procedure Laterality Date   CESAREAN SECTION N/A 03/13/2019   Procedure: CESAREAN SECTION;  Surgeon: Mitchel Honour, DO;  [facility];  Service: Obstetrics;  Laterality: N/A;   CESAREAN SECTION N/A 11/05/2020   Procedure: REPEAT CESAREAN SECTION EDC: 11-13-20 ALLERG: PENICILLINS;  Surgeon: Ranae Pila, MD;  Location: [facility] LD [facility]S;  Service: Obstetrics;  Laterality: N/A;   FOOT SURGERY Left     Family History  Problem Relation Age of Onset   Hypertension Mother    Heart disease Maternal Grandfather    Heart disease Paternal Grandfather     Social History:  reports that she has never smoked. She has never used smokeless tobacco. She reports that she does not currently use alcohol. She reports that she does not use drugs.  Allergies:  Allergies  Allergen Reactions   Chloraprep One Step [Chlorhexidine Gluconate] Hives   Penicillins Hives    Medications Prior to Admission  Medication Sig Dispense Refill   Acetaminophen (TYLENOL PO) Take by mouth every 6 (six) hours as needed.     amLODipine (NORVASC) 2.5 MG tablet Take 2.5 mg by mouth at bedtime.     dextroamphetamine (DEXEDRINE SPANSULE) 15 MG 24 hr capsule Take 30 mg by mouth daily.     SYEDA 3-0.03 MG tablet Take 1 tablet by mouth daily.     TRINTELLIX 20 MG TABS tablet Take 20 mg by mouth daily.      No results found for this or any previous visit (from the past 48 hours). No results found.  ROS: negative other than stated in HPI  Blood pressure (!) 140/90, pulse 96, temperature 97.9 F (36.6 C), temperature source  Oral, resp. rate 18, height 5\' 5"  (1.651 m), weight 74.8 kg, last menstrual period 06/22/2023, SpO2 100%, unknown if currently breastfeeding.  PHYSICAL EXAM: General: Resting comfortably in NAD  Lungs: Non-labored respiratinos  Studies Reviewed:    Assessment/Plan Left thyroid mass - Plan left hemithyroidectomy    @[facility] @ 07/20/2023, 8:27 AM

## 2023-07-20 NOTE — Transfer of Care (Signed)
 Immediate Anesthesia Transfer of Care Note  Patient: Laurie Hicks  Procedure(s) Performed: HEMITHYROIDECTOMY (Left: Neck)  Patient Location: PACU  Anesthesia Type:General  Level of Consciousness: awake  Airway & Oxygen Therapy: Patient Spontanous Breathing and Patient connected to face mask oxygen  Post-op Assessment: Report given to RN and Post -op Vital signs reviewed and stable  Post vital signs: Reviewed and stable  Last Vitals:  Vitals Value Taken Time  BP 121/58 07/20/23 1207  Temp 36.9 C 07/20/23 1207  Pulse 117 07/20/23 1214  Resp 18 07/20/23 1214  SpO2 100 % 07/20/23 1214  Vitals shown include unfiled device data.  Last Pain:  Vitals:   07/20/23 1207  TempSrc:   PainSc: 0-No pain         Complications: No notable events documented.

## 2023-07-20 NOTE — Anesthesia Postprocedure Evaluation (Signed)
 Anesthesia Post Note  Patient: Laurie Hicks  Procedure(s) Performed: HEMITHYROIDECTOMY (Left: Neck)     Patient location during evaluation: PACU Anesthesia Type: General Level of consciousness: sedated and patient cooperative Pain management: pain level controlled Vital Signs Assessment: post-procedure vital signs reviewed and stable Respiratory status: spontaneous breathing Cardiovascular status: stable Anesthetic complications: no  No notable events documented.  Last Vitals:  Vitals:   07/20/23 1230 07/20/23 1245  BP: 119/77 129/74  Pulse: 89 83  Resp: (!) 8 12  Temp:  37 C  SpO2: 99% 95%    Last Pain:  Vitals:   07/20/23 1245  TempSrc:   PainSc: 2                  Lewie Loron

## 2023-07-20 NOTE — Op Note (Signed)
 OPERATIVE NOTE  Zacari Radick Date/Time of Admission: 07/20/2023  7:32 AM  CSN: [id number];ZDG:[id number] Attending Provider: Ginger Carne, MD Room/Bed: MCPO/NONE DOB: [birth year] Age: 43 y.o.   Pre-Op Diagnosis: Thyroid neoplasm  Post-Op Diagnosis: Thyroid neoplasm  Procedure: Procedure(s): Left hemithyroidectomy  Anesthesia: General  Surgeon(s): Harland Dingwall, MD  Staff: Circulator: Oswaldo Conroy, RN; Tawny Hopping, RN Relief Scrub: Virgel Bouquet, RN Scrub Person: Pietro Cassis, RN; Jyl Heinz Circulator Assistant: Virgel Bouquet, RN  Implants: * No implants in log *  Specimens: ID Type Source Tests Collected by Time Destination  1 : Left thyroid Tissue PATH ENT excision SURGICAL PATHOLOGY Ginger Carne, MD 07/20/2023 1135     Complications: none  EBL: 25 ML  Condition: stable  Operative Findings:  1.5cm left superior thyroid mass  Description of Operation: After informed consent was obtained the patient was she was brought back to the operating room and intubated by anesthesia with a laryngeal monitoring tube.  Placement was confirmed and the proposed incision was infiltrated with 1% lidocaine with 1 100,000 epinephrine.  The neck was prepped with Betadine and draped in usual fashion.  A 5 cm incision was made just below the cricoid and dissection carried down through the platysma.  Small superior and inferior flaps were then raised and the strap muscles were divided in the midline approaching the thyroid isthmus.  The soft tissue and strap muscles was elevated off of the left thyroid gland.  The middle thyroid vein was ligated with the LigaSure.  Next the superior aspect was dissected off of the laryngeal cartilages and cricothyroid muscle.  The mass appeared to be at the most superior aspect of the left.  Lobe of the thyroid.  The superior thyroid artery and vein were ligated with the LigaSure leaving the superior  parathyroid in its anatomical location.  Next the inferior aspect was then dissected out the staying close to the capsule of the thyroid.  The recurrent laryngeal nerve was identified confirmed with the nerve monitor and the remaining thyroid gland was dissected out.  The isthmus of the thyroid was ligated in the midline over the trachea and the specimen sent for permanent pathology.  The incision was then closed using 3-0 Vicryl for the deep and 4-0 Monocryl in subcuticular fashion for the skin followed by Dermabond.  The patient was then transferred over to anesthesia for eventual discharge.

## 2023-07-20 NOTE — Discharge Instructions (Signed)
 Light activity x 1 week. Can shower tomorrow. Can leave incision open to air

## 2023-07-20 NOTE — Anesthesia Procedure Notes (Signed)
 Procedure Name: Intubation Date/Time: 07/20/2023 10:24 AM  Performed by: Marcy Siren, RNPre-anesthesia Checklist: Patient identified, Emergency Drugs available, Suction available and Patient being monitored Patient Re-evaluated:Patient Re-evaluated prior to induction Oxygen Delivery Method: Circle System Utilized Preoxygenation: Pre-oxygenation with 100% oxygen Induction Type: IV induction Ventilation: Mask ventilation without difficulty Laryngoscope Size: Mac and 3 Grade View: Grade I Tube type: Oral (NIMS tube) Tube size: 7.0 mm Number of attempts: 1 Airway Equipment and Method: Stylet and Oral airway Placement Confirmation: ETT inserted through vocal cords under direct vision, positive ETCO2 and breath sounds checked- equal and bilateral Secured at: 22 cm Tube secured with: Tape Dental Injury: Teeth and Oropharynx as per pre-operative assessment

## 2023-07-21 ENCOUNTER — Encounter (HOSPITAL_COMMUNITY): Payer: Self-pay | Admitting: Otolaryngology

## 2023-07-22 LAB — SURGICAL PATHOLOGY

## 2023-07-26 ENCOUNTER — Other Ambulatory Visit (HOSPITAL_BASED_OUTPATIENT_CLINIC_OR_DEPARTMENT_OTHER): Payer: Self-pay | Admitting: Otolaryngology

## 2023-07-26 DIAGNOSIS — C73 Malignant neoplasm of thyroid gland: Secondary | ICD-10-CM

## 2024-01-25 ENCOUNTER — Ambulatory Visit (HOSPITAL_COMMUNITY)
Admission: RE | Admit: 2024-01-25 | Discharge: 2024-01-25 | Disposition: A | Discharge status: Home / Self Care | Source: Ambulatory Visit | Attending: Otolaryngology | Admitting: Otolaryngology

## 2024-01-25 DIAGNOSIS — C73 Malignant neoplasm of thyroid gland: Secondary | ICD-10-CM | POA: Diagnosis present

## 2024-01-26 ENCOUNTER — Other Ambulatory Visit (HOSPITAL_COMMUNITY)
